# Patient Record
Sex: Female | Born: 1955
Health system: Southern US, Community
[De-identification: ages and names within clinical notes are randomized; demographics above are authoritative.]

## PROBLEM LIST (undated history)

## (undated) DIAGNOSIS — K5790 Diverticulosis of intestine, part unspecified, without perforation or abscess without bleeding: Secondary | ICD-10-CM

## (undated) DIAGNOSIS — F32A Depression, unspecified: Secondary | ICD-10-CM

## (undated) DIAGNOSIS — M1990 Unspecified osteoarthritis, unspecified site: Secondary | ICD-10-CM

## (undated) DIAGNOSIS — K611 Rectal abscess: Secondary | ICD-10-CM

## (undated) DIAGNOSIS — F329 Major depressive disorder, single episode, unspecified: Secondary | ICD-10-CM

## (undated) DIAGNOSIS — N390 Urinary tract infection, site not specified: Secondary | ICD-10-CM

## (undated) DIAGNOSIS — K648 Other hemorrhoids: Secondary | ICD-10-CM

## (undated) HISTORY — DX: Major depressive disorder, single episode, unspecified: F32.9

## (undated) HISTORY — DX: Rectal abscess: K61.1

## (undated) HISTORY — DX: Other hemorrhoids: K64.8

## (undated) HISTORY — DX: Diverticulosis of intestine, part unspecified, without perforation or abscess without bleeding: K57.90

## (undated) HISTORY — DX: Depression, unspecified: F32.A

## (undated) HISTORY — DX: Unspecified osteoarthritis, unspecified site: M19.90

## (undated) HISTORY — PX: OOPHORECTOMY: SHX86

## (undated) HISTORY — DX: Urinary tract infection, site not specified: N39.0

---

## 2000-12-08 HISTORY — PX: ABDOMINAL HYSTERECTOMY: SHX81

## 2004-10-08 ENCOUNTER — Ambulatory Visit: Payer: Self-pay | Admitting: Family Medicine

## 2004-12-19 LAB — HM PAP SMEAR: HM PAP: NORMAL

## 2005-04-07 HISTORY — PX: EXCISION OF ADNEXAL MASS: SHX5820

## 2005-04-24 ENCOUNTER — Inpatient Hospital Stay: Payer: Self-pay | Admitting: Unknown Physician Specialty

## 2005-10-16 ENCOUNTER — Ambulatory Visit: Payer: Self-pay | Admitting: Unknown Physician Specialty

## 2005-10-23 ENCOUNTER — Ambulatory Visit: Payer: Self-pay | Admitting: Unknown Physician Specialty

## 2007-04-01 ENCOUNTER — Ambulatory Visit: Payer: Self-pay | Admitting: Family Medicine

## 2007-09-14 ENCOUNTER — Ambulatory Visit: Payer: Self-pay | Admitting: Family Medicine

## 2007-12-09 HISTORY — PX: OTHER SURGICAL HISTORY: SHX169

## 2008-01-11 ENCOUNTER — Ambulatory Visit: Payer: Self-pay | Admitting: Surgery

## 2008-01-18 DIAGNOSIS — K612 Anorectal abscess: Secondary | ICD-10-CM | POA: Insufficient documentation

## 2008-11-20 ENCOUNTER — Emergency Department: Payer: Self-pay | Admitting: Emergency Medicine

## 2009-10-15 ENCOUNTER — Ambulatory Visit: Payer: Self-pay | Admitting: Gastroenterology

## 2009-10-15 LAB — HM COLONOSCOPY

## 2009-10-18 ENCOUNTER — Ambulatory Visit: Payer: Self-pay | Admitting: Family Medicine

## 2011-02-03 ENCOUNTER — Ambulatory Visit: Payer: Self-pay | Admitting: Family Medicine

## 2011-03-10 ENCOUNTER — Ambulatory Visit: Payer: Self-pay

## 2013-04-20 ENCOUNTER — Ambulatory Visit: Payer: Self-pay | Admitting: Family Medicine

## 2014-03-25 ENCOUNTER — Emergency Department: Payer: Self-pay | Admitting: Emergency Medicine

## 2014-03-25 LAB — CBC
HCT: 42.4 % (ref 35.0–47.0)
HGB: 13.8 g/dL (ref 12.0–16.0)
MCH: 28.4 pg (ref 26.0–34.0)
MCHC: 32.4 g/dL (ref 32.0–36.0)
MCV: 88 fL (ref 80–100)
PLATELETS: 228 10*3/uL (ref 150–440)
RBC: 4.84 10*6/uL (ref 3.80–5.20)
RDW: 13.2 % (ref 11.5–14.5)
WBC: 10.7 10*3/uL (ref 3.6–11.0)

## 2014-03-25 LAB — COMPREHENSIVE METABOLIC PANEL
ALK PHOS: 88 U/L
ALT: 26 U/L (ref 12–78)
Albumin: 3.9 g/dL (ref 3.4–5.0)
Anion Gap: 10 (ref 7–16)
BUN: 19 mg/dL — ABNORMAL HIGH (ref 7–18)
Bilirubin,Total: 0.5 mg/dL (ref 0.2–1.0)
CREATININE: 0.57 mg/dL — AB (ref 0.60–1.30)
Calcium, Total: 9.5 mg/dL (ref 8.5–10.1)
Chloride: 107 mmol/L (ref 98–107)
Co2: 22 mmol/L (ref 21–32)
EGFR (African American): >60
EGFR (Non-African Amer.): >60
Glucose: 98 mg/dL (ref 65–99)
Osmolality: 280 (ref 275–301)
POTASSIUM: 3.4 mmol/L — AB (ref 3.5–5.1)
SGOT(AST): 22 U/L (ref 15–37)
SODIUM: 139 mmol/L (ref 136–145)
Total Protein: 7.7 g/dL (ref 6.4–8.2)

## 2014-03-25 LAB — CK TOTAL AND CKMB (NOT AT ARMC)
CK, Total: 100 U/L
CK-MB: 1.1 ng/mL (ref 0.5–3.6)

## 2014-03-25 LAB — D-DIMER(ARMC): D-DIMER: 329 ng/mL

## 2014-03-25 LAB — TROPONIN I

## 2015-01-04 ENCOUNTER — Ambulatory Visit: Payer: Self-pay | Admitting: Family Medicine

## 2015-04-30 ENCOUNTER — Ambulatory Visit
Admission: RE | Admit: 2015-04-30 | Discharge: 2015-04-30 | Disposition: A | Discharge status: Home / Self Care | Payer: BLUE CROSS/BLUE SHIELD | Source: Ambulatory Visit | Attending: Family Medicine | Admitting: Family Medicine

## 2015-04-30 ENCOUNTER — Other Ambulatory Visit: Payer: Self-pay | Admitting: Family Medicine

## 2015-04-30 DIAGNOSIS — R0789 Other chest pain: Secondary | ICD-10-CM

## 2015-07-16 ENCOUNTER — Ambulatory Visit (INDEPENDENT_AMBULATORY_CARE_PROVIDER_SITE_OTHER): Payer: BLUE CROSS/BLUE SHIELD | Admitting: Family Medicine

## 2015-07-16 ENCOUNTER — Encounter: Payer: Self-pay | Admitting: Family Medicine

## 2015-07-16 VITALS — BP 100/64 | HR 80 | Temp 98.0°F | Resp 16 | Ht 64.0 in | Wt 163.0 lb

## 2015-07-16 DIAGNOSIS — I340 Nonrheumatic mitral (valve) insufficiency: Secondary | ICD-10-CM | POA: Insufficient documentation

## 2015-07-16 DIAGNOSIS — R739 Hyperglycemia, unspecified: Secondary | ICD-10-CM | POA: Insufficient documentation

## 2015-07-16 DIAGNOSIS — E559 Vitamin D deficiency, unspecified: Secondary | ICD-10-CM | POA: Insufficient documentation

## 2015-07-16 DIAGNOSIS — K631 Perforation of intestine (nontraumatic): Secondary | ICD-10-CM

## 2015-07-16 DIAGNOSIS — B370 Candidal stomatitis: Secondary | ICD-10-CM | POA: Diagnosis not present

## 2015-07-16 DIAGNOSIS — E785 Hyperlipidemia, unspecified: Secondary | ICD-10-CM | POA: Insufficient documentation

## 2015-07-16 DIAGNOSIS — N951 Menopausal and female climacteric states: Secondary | ICD-10-CM | POA: Insufficient documentation

## 2015-07-16 DIAGNOSIS — R0602 Shortness of breath: Secondary | ICD-10-CM | POA: Insufficient documentation

## 2015-07-16 DIAGNOSIS — M9420 Chondromalacia, unspecified site: Secondary | ICD-10-CM | POA: Insufficient documentation

## 2015-07-16 DIAGNOSIS — M1990 Unspecified osteoarthritis, unspecified site: Secondary | ICD-10-CM | POA: Insufficient documentation

## 2015-07-16 DIAGNOSIS — E538 Deficiency of other specified B group vitamins: Secondary | ICD-10-CM | POA: Insufficient documentation

## 2015-07-16 DIAGNOSIS — F32A Depression, unspecified: Secondary | ICD-10-CM | POA: Insufficient documentation

## 2015-07-16 DIAGNOSIS — F329 Major depressive disorder, single episode, unspecified: Secondary | ICD-10-CM | POA: Insufficient documentation

## 2015-07-16 DIAGNOSIS — R202 Paresthesia of skin: Secondary | ICD-10-CM | POA: Insufficient documentation

## 2015-07-16 DIAGNOSIS — E782 Mixed hyperlipidemia: Secondary | ICD-10-CM | POA: Insufficient documentation

## 2015-07-16 DIAGNOSIS — L538 Other specified erythematous conditions: Secondary | ICD-10-CM | POA: Insufficient documentation

## 2015-07-16 HISTORY — DX: Perforation of intestine (nontraumatic): K63.1

## 2015-07-16 MED ORDER — NYSTATIN 100000 UNIT/ML MT SUSP: 5.0000 mL | Freq: Four times a day (QID) | OROMUCOSAL | Status: DC

## 2015-07-16 NOTE — Progress Notes (Signed)
Subjective:    Patient ID: Patricia Hicks, female    DOB: 1956/05/03, 59 y.o.   MRN: [id number]  HPI  Dry Mouth Pt is c/o dry mouth. This has been occuring for 2 weeks. Pt also notes there is white film on her tongue. The problem is waxing and waning, pt states she thought it was improving until she ate at [employer] yesterday. Some things sting her lips. No recent antibiotics. Did have a cortisone shot in knee. Did great ( as an aside).    Review of Systems  Constitutional: Positive for appetite change (due to discomfort in mouth) and fatigue. Negative for fever, chills, diaphoresis, activity change and unexpected weight change.  HENT: Negative for drooling and mouth sores.        Mouth discomfort present  Endocrine: Positive for polydipsia and polyuria (due to increased thirste, most likely due to dry mouth). Negative for polyphagia (possibly secondary to dry mouth).   BP 100/64 mmHg  Pulse 80  Temp(Src) 98 F (36.7 C) (Oral)  Resp 16  Ht 5\' 4"  (1.626 m)  Wt 163 lb (73.936 kg)  BMI 27.97 kg/m2   Patient Active Problem List   Diagnosis Date Noted  . Arthritis 07/16/2015  . 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 B12 deficiency 07/16/2015  . Chondromalacia 07/16/2015  . Clinical depression 07/16/2015  . Elevated blood sugar 07/16/2015  . Elevated cholesterol with elevated triglycerides 07/16/2015  . MI (mitral incompetence) 07/16/2015  . Burning or prickling sensation 07/16/2015  . Post menopausal syndrome 07/16/2015  . Disruption of rectum 07/16/2015  . Shortness of breath at rest 07/16/2015  . Avitaminosis D 07/16/2015  . Abscess of anal and rectal regions 01/18/2008   No past medical history on file. No current outpatient prescriptions on file prior to visit.   No current facility-administered medications on file prior to visit.   Allergies  Allergen Reactions  . Sulfamethoxazole-Trimethoprim    Past Surgical History  Procedure Laterality Date  . Abdominal  hysterectomy  2002    Due to Fibroids  . Excision of adnexal mass Right 04/2005  . Oophorectomy Bilateral   . Rectal tear  2009    Rectal Abscess   History   Social History  . Marital Status: Married    Spouse Name: N/A  . Number of Children: 1  . Years of Education: PhD   Occupational History  . 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 Smoking status: Former Smoker -- 20 years    Types: Cigarettes    Quit date: 12/08/1992  . Smokeless tobacco: Never Used  . Alcohol Use: Yes     Comment: Occasional Wine  . Drug Use: No  . Sexual Activity: Not on file   Other Topics Concern  . Not on file   Social History Narrative   Family History  Problem Relation Age of Onset  . Dementia Father   . Congestive Heart Failure Father   . Thyroid disease Sister   . Suicidality Brother   . Breast cancer Maternal Grandmother   . Arrhythmia Sister        Objective:   Physical Exam  Constitutional: She is oriented to person, place, and time. She appears well-developed and well-nourished.  HENT:  Head: Normocephalic and atraumatic.  Nose: Nose normal.  Mouth/Throat: Oropharynx is clear and moist.  May some some white exudate on tongue.   Neck: Normal range of motion.  Neurological: She is alert and oriented to person, place, and time.   BP 100/64 mmHg  Pulse 80  Temp(Src) 98 F (36.7 C) (Oral)  Resp 16  Ht  (1.626 m)  Wt 163 lb (73.936 kg)  BMI 27.97 kg/m2      Assessment & Plan:  1. Thrush Condition is worsening. Will start medication for better control.  Patient instructed to call back if condition worsens or does not improve.    - nystatin (MYCOSTATIN) 100000 UNIT/ML suspension; Take 5 mLs (500,000 Units total) by mouth 4 (four) times daily. Swish and swallow.  Dispense: 60 mL; Refill: 0  Lorie Phenix, MD

## 2016-05-15 ENCOUNTER — Encounter: Payer: Self-pay | Admitting: Primary Care

## 2016-05-15 ENCOUNTER — Ambulatory Visit (INDEPENDENT_AMBULATORY_CARE_PROVIDER_SITE_OTHER): Payer: BLUE CROSS/BLUE SHIELD | Admitting: Primary Care

## 2016-05-15 VITALS — BP 104/64 | HR 58 | Temp 98.0°F | Ht 64.0 in | Wt 146.0 lb

## 2016-05-15 DIAGNOSIS — M1990 Unspecified osteoarthritis, unspecified site: Secondary | ICD-10-CM | POA: Diagnosis not present

## 2016-05-15 DIAGNOSIS — E782 Mixed hyperlipidemia: Secondary | ICD-10-CM | POA: Diagnosis not present

## 2016-05-15 DIAGNOSIS — F329 Major depressive disorder, single episode, unspecified: Secondary | ICD-10-CM | POA: Diagnosis not present

## 2016-05-15 DIAGNOSIS — E538 Deficiency of other specified B group vitamins: Secondary | ICD-10-CM

## 2016-05-15 DIAGNOSIS — Z0000 Encounter for general adult medical examination without abnormal findings: Secondary | ICD-10-CM | POA: Diagnosis not present

## 2016-05-15 DIAGNOSIS — Z1239 Encounter for other screening for malignant neoplasm of breast: Secondary | ICD-10-CM | POA: Diagnosis not present

## 2016-05-15 DIAGNOSIS — F32A Depression, unspecified: Secondary | ICD-10-CM

## 2016-05-15 DIAGNOSIS — K631 Perforation of intestine (nontraumatic): Secondary | ICD-10-CM

## 2016-05-15 LAB — CBC
HCT: 40.6 % (ref 36.0–46.0)
Hemoglobin: 13.4 g/dL (ref 12.0–15.0)
MCHC: 32.9 g/dL (ref 30.0–36.0)
MCV: 87.5 fl (ref 78.0–100.0)
PLATELETS: 250 10*3/uL (ref 150.0–400.0)
RBC: 4.64 Mil/uL (ref 3.87–5.11)
RDW: 13.5 % (ref 11.5–15.5)
WBC: 7.2 10*3/uL (ref 4.0–10.5)

## 2016-05-15 LAB — COMPREHENSIVE METABOLIC PANEL
ALK PHOS: 61 U/L (ref 39–117)
ALT: 12 U/L (ref 0–35)
AST: 15 U/L (ref 0–37)
Albumin: 4.3 g/dL (ref 3.5–5.2)
BILIRUBIN TOTAL: 0.8 mg/dL (ref 0.2–1.2)
BUN: 7 mg/dL (ref 6–23)
CO2: 30 meq/L (ref 19–32)
CREATININE: 0.67 mg/dL (ref 0.40–1.20)
Calcium: 9.5 mg/dL (ref 8.4–10.5)
Chloride: 103 mEq/L (ref 96–112)
GFR: 95.14 mL/min (ref >60.00)
GLUCOSE: 93 mg/dL (ref 70–99)
Potassium: 3.8 mEq/L (ref 3.5–5.1)
SODIUM: 140 meq/L (ref 135–145)
TOTAL PROTEIN: 6.9 g/dL (ref 6.0–8.3)

## 2016-05-15 LAB — HEMOGLOBIN A1C: Hgb A1c MFr Bld: 5.2 % (ref 4.6–6.5)

## 2016-05-15 LAB — VITAMIN D 25 HYDROXY (VIT D DEFICIENCY, FRACTURES): VITD: 42.14 ng/mL (ref 30.00–100.00)

## 2016-05-15 LAB — LIPID PANEL
CHOLESTEROL: 196 mg/dL (ref 0–200)
HDL: 68.6 mg/dL (ref >39.00)
LDL CALC: 110 mg/dL — AB (ref 0–99)
NonHDL: 127.23
TRIGLYCERIDES: 85 mg/dL (ref 0.0–149.0)
Total CHOL/HDL Ratio: 3
VLDL: 17 mg/dL (ref 0.0–40.0)

## 2016-05-15 LAB — TSH: TSH: 1.56 u[IU]/mL (ref 0.35–4.50)

## 2016-05-15 LAB — VITAMIN B12: VITAMIN B 12: 453 pg/mL (ref 211–911)

## 2016-05-15 NOTE — Progress Notes (Signed)
Pre visit review using our clinic review tool, if applicable. No additional management support is needed unless otherwise documented below in the visit note. 

## 2016-05-15 NOTE — Assessment & Plan Note (Signed)
Tetanus up-to-date. Declines Zostavax, will ask again next year. Eats a healthy diet and does not regularly exercise. Discussed importance of regular exercise. Mammogram ordered. Colonoscopy up-to-date. Exam unremarkable. Labs pending. Follow-up in one year for repeat physical.

## 2016-05-15 NOTE — Assessment & Plan Note (Signed)
Labs pending today.  

## 2016-05-15 NOTE — Assessment & Plan Note (Signed)
History of rectal abscess that was repaired in 2009. Does experience occasional bouts, which will reside with warm sits baths.

## 2016-05-15 NOTE — Patient Instructions (Addendum)
Complete lab work prior to leaving today. I will notify you of your results once received.   You will be contacted regarding your Mammogram.  Please let us know if you have not heard back within one week.   Continue your efforts towards a healthy lifestyle.   Ensure you are consuming 64 ounces of water daily.  Start exercising. You should be getting 1 hour of moderate intensity exercise 5 days weekly.  Follow up in 1 year for repeat physical or sooner if needed.  It was a pleasure to meet you today! Please don't hesitate to call me with any questions. Welcome to [facility]!

## 2016-05-15 NOTE — Assessment & Plan Note (Signed)
Vegetarian. Will obtain vitamin B12 levels today. Currently managed on 1000 g daily.

## 2016-05-15 NOTE — Assessment & Plan Note (Signed)
Intermittent, feels stable at this time. Does not believe she needs medications. Exam without evidence of depression.

## 2016-05-15 NOTE — Assessment & Plan Note (Signed)
Chronic. Did test slightly positive for rheumatoid arthritis, strong family history. Does not currently follow with rheumatology and she defers at this time as she does not wish to pursue medications.

## 2016-05-15 NOTE — Progress Notes (Signed)
Subjective:    Patient ID: Patricia Hicks, female    DOB: [birth year], 60 y.o.   MRN: [id number]  HPI  Ms. Patricia Hicks is a 60 year old female who presents today to establish care, discuss the problems mentioned below, and for complete physical. Will obtain old records. Her last physical was one year ago.   1) Vitamin B 12 Deficiency: Currently managed on 1000 mcg daily. She is a vegetarian.   2) Arthritis: Located to her left knee, feet, ankles, hands. She had evaluation for rheumatoid arthritis and did test positive. She does not currently follow with rheumatology as she does not wish to take medications. She once experienced dry eyes and dry mouth last summer that lasted through fall. She believes she has Sjograns, but was never diagnosed. Denies symptoms currently.  Immunizations: -Tetanus: Completed in 2013. -Influenza: Does not complete. -Shingles: Declines, would like to defer until next year.  Diet: Endorses a healthy diet. Breakfast: Toast, egg, nut mixtures, fruit Lunch: Salad, beans Dinner: Vegetables, grains Snacks: Crackers Desserts: Chocolate, cake.  Beverages: Coffee, water  Exercise: She does not currently exercise Eye exam: Completed in 2016 Dental exam: Completes every 6 month Colonoscopy: Completed 5-6 years ago. Normal. Pap Smear: Complete hysterectomy Mammogram: Completed 1 year ago, family history of breast cancer in her mother. Due.    Review of Systems  Constitutional: Negative for unexpected weight change.  HENT: Negative for rhinorrhea.   Respiratory: Negative for cough and shortness of breath.   Cardiovascular: Negative for chest pain.  Gastrointestinal: Negative for diarrhea and constipation.       History of rectal abscess in 2009. Does have occasional flares, will do epsom salt soaks.   Genitourinary: Negative for difficulty urinating.  Musculoskeletal: Positive for arthralgias. Negative for myalgias.  Skin: Negative for rash.  Allergic/Immunologic:  Positive for environmental allergies.  Neurological: Negative for dizziness, numbness and headaches.  Psychiatric/Behavioral:       Denies concerns for anxiety and depression. Does have situational depression, doesn't bother her.       Past Medical History  Diagnosis Date  . Urinary tract infection   . Arthritis   . Rectal abscess   . Depression      Social History   Social History  . Marital Status: Married    Spouse Name: N/A  . Number of Children: 1  . Years of Education: PhD   Occupational History  . 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 Smoking status: Former Smoker -- 20 years    Types: Cigarettes    Quit date: 12/08/1992  . Smokeless tobacco: Never Used  . Alcohol Use: Yes     Comment: Occasional Wine  . Drug Use: No  . Sexual Activity: Not on file   Other Topics Concern  . Not on file   Social History Narrative   Married.   1 child.   Works at [employer], teaches Social Work.   Enjoys gardening, walking her dog.     Past Surgical History  Procedure Laterality Date  . Abdominal hysterectomy  2002    Due to Fibroids  . Excision of adnexal mass Right 04/2005  . Oophorectomy Bilateral   . Rectal tear  2009    Rectal Abscess    Family History  Problem Relation Age of Onset  . Dementia Father   . Congestive Heart Failure Father   . Thyroid disease Sister   . Suicidality Brother   .  Breast cancer Maternal Grandmother   . Arrhythmia Sister   . Hypothyroidism Sister     Allergies  Allergen Reactions  . Sulfamethoxazole-Trimethoprim     Current Outpatient Prescriptions on File Prior to Visit  Medication Sig Dispense Refill  . Cholecalciferol (VITAMIN D) 2000 UNITS CAPS Take by mouth. Only takes in the winter    . vitamin B-12 (CYANOCOBALAMIN) 1000 MCG tablet Take 1,000 mcg by mouth daily.      No current facility-administered medications on file prior to visit.    BP 104/64 mmHg  Pulse  58  Temp(Src) 98 F (36.7 C) (Oral)  Ht  (1.626 m)  Wt 146 lb (66.225 kg)  BMI 25.05 kg/m2  SpO2 98%    Objective:   Physical Exam  Constitutional: She is oriented to person, place, and time. She appears well-nourished.  HENT:  Right Ear: Tympanic membrane and ear canal normal.  Left Ear: Tympanic membrane and ear canal normal.  Nose: Nose normal.  Mouth/Throat: Oropharynx is clear and moist.  Eyes: Conjunctivae and EOM are normal. Pupils are equal, round, and reactive to light.  Neck: Neck supple. No thyromegaly present.  Cardiovascular: Normal rate and regular rhythm.   No murmur heard. Pulmonary/Chest: Effort normal and breath sounds normal. She has no rales.  Abdominal: Soft. Bowel sounds are normal. There is no tenderness.  Musculoskeletal: Normal range of motion.  Lymphadenopathy:    She has no cervical adenopathy.  Neurological: She is alert and oriented to person, place, and time. She has normal reflexes. No cranial nerve deficit.  Skin: Skin is warm and dry. No rash noted.  Psychiatric: She has a normal mood and affect.          Assessment & Plan:

## 2016-05-16 LAB — HEPATITIS C ANTIBODY: HCV Ab: NEGATIVE

## 2016-05-30 ENCOUNTER — Encounter: Payer: Self-pay | Admitting: Primary Care

## 2016-09-08 ENCOUNTER — Ambulatory Visit
Admission: RE | Admit: 2016-09-08 | Discharge: 2016-09-08 | Disposition: A | Discharge status: Home / Self Care | Payer: BLUE CROSS/BLUE SHIELD | Source: Ambulatory Visit | Attending: Primary Care | Admitting: Primary Care

## 2016-09-08 ENCOUNTER — Other Ambulatory Visit: Payer: Self-pay | Admitting: Primary Care

## 2016-09-08 DIAGNOSIS — Z1239 Encounter for other screening for malignant neoplasm of breast: Secondary | ICD-10-CM

## 2016-09-08 DIAGNOSIS — Z1231 Encounter for screening mammogram for malignant neoplasm of breast: Secondary | ICD-10-CM

## 2016-09-26 DIAGNOSIS — M1712 Unilateral primary osteoarthritis, left knee: Secondary | ICD-10-CM | POA: Diagnosis not present

## 2016-11-17 ENCOUNTER — Ambulatory Visit (INDEPENDENT_AMBULATORY_CARE_PROVIDER_SITE_OTHER): Payer: BLUE CROSS/BLUE SHIELD | Admitting: Primary Care

## 2016-11-17 ENCOUNTER — Encounter: Payer: Self-pay | Admitting: Primary Care

## 2016-11-17 VITALS — BP 136/84 | HR 86 | Temp 98.1°F | Wt 145.5 lb

## 2016-11-17 DIAGNOSIS — J069 Acute upper respiratory infection, unspecified: Secondary | ICD-10-CM

## 2016-11-17 MED ORDER — BENZONATATE 200 MG PO CAPS: 200.0000 mg | ORAL_CAPSULE | Freq: Three times a day (TID) | ORAL | 0 refills | Status: DC | PRN

## 2016-11-17 MED ORDER — AZITHROMYCIN 250 MG PO TABS: ORAL_TABLET | ORAL | 0 refills | Status: DC

## 2016-11-17 NOTE — Patient Instructions (Signed)
Start Azithromycin antibiotics. Take 2 tablets by mouth today, then 1 tablet daily for 4 additional days.  You may take Benzonatate capsules for cough. Take 1 capsule by mouth three times daily as needed for cough. You could also try taking Delsym for cough, this may be purchased over the counter.  Throat drainage: An antihistamine such as Claritin, Zyrtec, Allegra will help to dry up any drainage.  Ensure you are staying hydrated with water and rest.  It was a pleasure to see you today!

## 2016-11-17 NOTE — Progress Notes (Signed)
Subjective:    Patient ID: Oda KiltsBeth Dunavan, female    DOB: 25-Sep-1956, 60 y.o.   MRN: [id number]  HPI  [name] is a 60 year old female who presents today with a chief complaint of cough. She also reports voice hoarseness, fatigue, sore throat, chills. Her cough is productive with yellowish sputum. Her symptoms have been present for the past 10 + days. She's taken Robitussin, Nyquil with temporary improvement. Overall she started feeling worse this past weekend. She denies fevers, sick contacts. She will be leaving for a trip abroad later this week.  Review of Systems  Constitutional: Positive for chills and fatigue. Negative for fever.  HENT: Positive for congestion, sinus pressure, sore throat and voice change.   Respiratory: Positive for cough. Negative for shortness of breath and wheezing.        Past Medical History:  Diagnosis Date  . Arthritis   . Depression   . Diverticulosis    found via colonoscopy  . Internal hemorrhoids   . Rectal abscess   . Urinary tract infection      Social History   Social History  . Marital status: Married    Spouse name: N/A  . Number of children: 1  . Years of education: PhD   Occupational History  . 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 Smoking status: Former Smoker    Years: 20.00    Types: Cigarettes    Quit date: 12/08/1992  . Smokeless tobacco: Never Used  . Alcohol use Yes     Comment: Occasional Wine  . Drug use: No  . Sexual activity: Not on file   Other Topics Concern  . Not on file   Social History Narrative   Married.   1 child.   Works at [employer], teaches Social Work.   Enjoys gardening, walking her dog.     Past Surgical History:  Procedure Laterality Date  . ABDOMINAL HYSTERECTOMY  2002   Due to Fibroids  . EXCISION OF ADNEXAL MASS Right 04/2005  . OOPHORECTOMY Bilateral   . Rectal tear  2009   Rectal Abscess    Family History  Problem Relation  Age of Onset  . Dementia Father   . Congestive Heart Failure Father   . Thyroid disease Sister   . Suicidality Brother   . Breast cancer Maternal Grandmother   . Breast cancer Mother [id number]    dx twice 1977 and [phone]  . Arrhythmia Sister   . Hypothyroidism Sister     Allergies  Allergen Reactions  . Sulfamethoxazole-Trimethoprim     Current Outpatient Prescriptions on File Prior to Visit  Medication Sig Dispense Refill  . vitamin B-12 (CYANOCOBALAMIN) 1000 MCG tablet Take 1,000 mcg by mouth daily.     . Cholecalciferol (VITAMIN D) 2000 UNITS CAPS Take by mouth. Only takes in the winter     No current facility-administered medications on file prior to visit.     BP 136/84   Pulse 86   Temp 98.1 F (36.7 C) (Oral)   Wt 145 lb 8 oz (66 kg)   SpO2 99%   BMI 24.98 kg/m    Objective:   Physical Exam  Constitutional: She appears well-nourished.  HENT:  Right Ear: Tympanic membrane and ear canal normal.  Left Ear: Tympanic membrane and ear canal normal.  Nose: Right sinus exhibits no maxillary sinus tenderness and no frontal sinus tenderness. Left sinus exhibits no  maxillary sinus tenderness and no frontal sinus tenderness.  Mouth/Throat: Oropharynx is clear and moist.  Eyes: Conjunctivae are normal.  Neck: Neck supple.  Cardiovascular: Normal rate and regular rhythm.   Pulmonary/Chest: Effort normal. She has wheezes in the right upper field and the left upper field. She has rhonchi in the right upper field and the left upper field. She has no rales.  Lymphadenopathy:    She has no cervical adenopathy.  Skin: Skin is warm and dry.          Assessment & Plan:  URI:  Cough, congestion, fatigue x 10 days. Overall feeling worse, temporary improvement with OTC treatment. Exam today with rhonchi and mild wheezing to upper fields. Persistent cough during exam. Does appear ill. Given duration of symptoms, no improvement (feeling worse), will treat. Rx for Zpak, Tessalon pearls  sent to pharmacy. Discussed use of antihistamines for drainage. Fluids, rest, follow up PRN.  Morrie Sheldonlark,Ladasia Sircy Kendal, NP

## 2016-11-17 NOTE — Progress Notes (Signed)
Pre visit review using our clinic review tool, if applicable. No additional management support is needed unless otherwise documented below in the visit note. 

## 2017-06-16 DIAGNOSIS — L708 Other acne: Secondary | ICD-10-CM | POA: Diagnosis not present

## 2017-12-15 ENCOUNTER — Encounter: Payer: BLUE CROSS/BLUE SHIELD | Admitting: Primary Care

## 2017-12-30 ENCOUNTER — Ambulatory Visit (INDEPENDENT_AMBULATORY_CARE_PROVIDER_SITE_OTHER): Payer: BLUE CROSS/BLUE SHIELD | Admitting: Primary Care

## 2017-12-30 ENCOUNTER — Encounter: Payer: Self-pay | Admitting: Primary Care

## 2017-12-30 VITALS — BP 120/76 | HR 66 | Temp 97.9°F | Ht 64.0 in | Wt 151.4 lb

## 2017-12-30 DIAGNOSIS — M1990 Unspecified osteoarthritis, unspecified site: Secondary | ICD-10-CM

## 2017-12-30 DIAGNOSIS — E782 Mixed hyperlipidemia: Secondary | ICD-10-CM | POA: Diagnosis not present

## 2017-12-30 DIAGNOSIS — F3342 Major depressive disorder, recurrent, in full remission: Secondary | ICD-10-CM | POA: Diagnosis not present

## 2017-12-30 DIAGNOSIS — E559 Vitamin D deficiency, unspecified: Secondary | ICD-10-CM

## 2017-12-30 DIAGNOSIS — E538 Deficiency of other specified B group vitamins: Secondary | ICD-10-CM | POA: Diagnosis not present

## 2017-12-30 DIAGNOSIS — Z0000 Encounter for general adult medical examination without abnormal findings: Secondary | ICD-10-CM | POA: Diagnosis not present

## 2017-12-30 DIAGNOSIS — Z1231 Encounter for screening mammogram for malignant neoplasm of breast: Secondary | ICD-10-CM | POA: Diagnosis not present

## 2017-12-30 DIAGNOSIS — Z1239 Encounter for other screening for malignant neoplasm of breast: Secondary | ICD-10-CM

## 2017-12-30 LAB — COMPREHENSIVE METABOLIC PANEL
ALK PHOS: 61 U/L (ref 39–117)
ALT: 11 U/L (ref 0–35)
AST: 14 U/L (ref 0–37)
Albumin: 4.1 g/dL (ref 3.5–5.2)
BILIRUBIN TOTAL: 0.7 mg/dL (ref 0.2–1.2)
BUN: 11 mg/dL (ref 6–23)
CO2: 29 meq/L (ref 19–32)
Calcium: 9.4 mg/dL (ref 8.4–10.5)
Chloride: 103 mEq/L (ref 96–112)
Creatinine, Ser: 0.63 mg/dL (ref 0.40–1.20)
GFR: 101.6 mL/min (ref >60.00)
GLUCOSE: 96 mg/dL (ref 70–99)
Potassium: 4.2 mEq/L (ref 3.5–5.1)
SODIUM: 140 meq/L (ref 135–145)
TOTAL PROTEIN: 7 g/dL (ref 6.0–8.3)

## 2017-12-30 LAB — LIPID PANEL
CHOLESTEROL: 225 mg/dL — AB (ref 0–200)
HDL: 68.2 mg/dL (ref >39.00)
LDL CALC: 141 mg/dL — AB (ref 0–99)
NonHDL: 156.9
TRIGLYCERIDES: 81 mg/dL (ref 0.0–149.0)
Total CHOL/HDL Ratio: 3
VLDL: 16.2 mg/dL (ref 0.0–40.0)

## 2017-12-30 LAB — VITAMIN D 25 HYDROXY (VIT D DEFICIENCY, FRACTURES): VITD: 29.54 ng/mL — AB (ref 30.00–100.00)

## 2017-12-30 LAB — VITAMIN B12: Vitamin B-12: 209 pg/mL — ABNORMAL LOW (ref 211–911)

## 2017-12-30 NOTE — Assessment & Plan Note (Signed)
Denies symptoms and a history of depression.

## 2017-12-30 NOTE — Assessment & Plan Note (Signed)
Due for repeat Vitamin D today. Pending. Currently not taking Vitamin D.

## 2017-12-30 NOTE — Patient Instructions (Signed)
Stop by the lab prior to leaving today. I will notify you of your results once received.   Call the [facility] to schedule your mammogram.   Start exercising. You should be getting 150 minutes of moderate intensity exercise weekly.  Limit alcohol and sweets to avoid excess calories.  Follow up in 1 year for your annual exam or sooner if needed.  It was a pleasure to see you today!   Preventive Care 40-64 Years, Female Preventive care refers to lifestyle choices and visits with your health care provider that can promote health and wellness. What does preventive care include?  A yearly physical exam. This is also called an annual well check.  Dental exams once or twice a year.  Routine eye exams. Ask your health care provider how often you should have your eyes checked.  Personal lifestyle choices, including: ? Daily care of your teeth and gums. ? Regular physical activity. ? Eating a healthy diet. ? Avoiding tobacco and drug use. ? Limiting alcohol use. ? Practicing safe sex. ? Taking low-dose aspirin daily starting at age 41. ? Taking vitamin and mineral supplements as recommended by your health care provider. What happens during an annual well check? The services and screenings done by your health care provider during your annual well check will depend on your age, overall health, lifestyle risk factors, and family history of disease. Counseling Your health care provider may ask you questions about your:  Alcohol use.  Tobacco use.  Drug use.  Emotional well-being.  Home and relationship well-being.  Sexual activity.  Eating habits.  Work and work Statistician.  Method of birth control.  Menstrual cycle.  Pregnancy history.  Screening You may have the following tests or measurements:  Height, weight, and BMI.  Blood pressure.  Lipid and cholesterol levels. These may be checked every 5 years, or more frequently if you are over 25 years  old.  Skin check.  Lung cancer screening. You may have this screening every year starting at age 19 if you have a 30-pack-year history of smoking and currently smoke or have quit within the past 15 years.  Fecal occult blood test (FOBT) of the stool. You may have this test every year starting at age 68.  Flexible sigmoidoscopy or colonoscopy. You may have a sigmoidoscopy every 5 years or a colonoscopy every 10 years starting at age 35.  Hepatitis C blood test.  Hepatitis B blood test.  Sexually transmitted disease (STD) testing.  Diabetes screening. This is done by checking your blood sugar (glucose) after you have not eaten for a while (fasting). You may have this done every 1-3 years.  Mammogram. This may be done every 1-2 years. Talk to your health care provider about when you should start having regular mammograms. This may depend on whether you have a family history of breast cancer.  BRCA-related cancer screening. This may be done if you have a family history of breast, ovarian, tubal, or peritoneal cancers.  Pelvic exam and Pap test. This may be done every 3 years starting at age 67. Starting at age 54, this may be done every 5 years if you have a Pap test in combination with an HPV test.  Bone density scan. This is done to screen for osteoporosis. You may have this scan if you are at high risk for osteoporosis.  Discuss your test results, treatment options, and if necessary, the need for more tests with your health care provider. Vaccines Your health care  provider may recommend certain vaccines, such as:  Influenza vaccine. This is recommended every year.  Tetanus, diphtheria, and acellular pertussis (Tdap, Td) vaccine. You may need a Td booster every 10 years.  Varicella vaccine. You may need this if you have not been vaccinated.  Zoster vaccine. You may need this after age 92.  Measles, mumps, and rubella (MMR) vaccine. You may need at least one dose of MMR if you were  born in 1957 or later. You may also need a second dose.  Pneumococcal 13-valent conjugate (PCV13) vaccine. You may need this if you have certain conditions and were not previously vaccinated.  Pneumococcal polysaccharide (PPSV23) vaccine. You may need one or two doses if you smoke cigarettes or if you have certain conditions.  Meningococcal vaccine. You may need this if you have certain conditions.  Hepatitis A vaccine. You may need this if you have certain conditions or if you travel or work in places where you may be exposed to hepatitis A.  Hepatitis B vaccine. You may need this if you have certain conditions or if you travel or work in places where you may be exposed to hepatitis B.  Haemophilus influenzae type b (Hib) vaccine. You may need this if you have certain conditions.  Talk to your health care provider about which screenings and vaccines you need and how often you need them. This information is not intended to replace advice given to you by your health care provider. Make sure you discuss any questions you have with your health care provider. Document Released: 12/21/2015 Document Revised: 08/13/2016 Document Reviewed: 09/25/2015 Elsevier Interactive Patient Education  [employer].

## 2017-12-30 NOTE — Progress Notes (Signed)
Subjective:    Patient ID: Patricia Hicks, female    DOB: 1956/10/15, 62 y.o.   MRN: [id number]  HPI  Patricia Hicks is a 62 year old female who presents today for complete physical.  Immunizations: -Tetanus: Completed in 2013 -Influenza: Declines   Diet: She endorses a healthy diet. Breakfast: Fruit with yogurt, toast with butter; eggs and toast Lunch: Salad, soup Dinner: Soup, potatoes, vegetables Snacks: Occasionally, candied ginger Desserts: Cookies, candy (Daily) Beverages: Coffee, wine, water  Exercise: She is not currently exercising, does walk her dog daily. Starting to do hand weights. Plans on starting Yoga and Joanna.  Eye exam: Completed years ago Dental exam: Completes annually. Colonoscopy: Completed in 2010, due in 2020 Pap Smear: Hysterectomy Mammogram: Completed in 2017, due again.    Review of Systems  Constitutional: Negative for unexpected weight change.  HENT: Negative for rhinorrhea.   Respiratory: Negative for cough and shortness of breath.   Cardiovascular: Negative for chest pain.  Gastrointestinal: Negative for constipation and diarrhea.  Genitourinary: Negative for difficulty urinating and menstrual problem.  Musculoskeletal: Negative for arthralgias and myalgias.  Skin: Negative for rash.  Allergic/Immunologic: Negative for environmental allergies.  Neurological: Negative for dizziness, numbness and headaches.  Psychiatric/Behavioral:       Denies concerns for depression and anxiety       Past Medical History:  Diagnosis Date  . Arthritis   . Depression   . Diverticulosis    found via colonoscopy  . Internal hemorrhoids   . Rectal abscess   . Urinary tract infection      Social History   Socioeconomic History  . Marital status: Married    Spouse name: Not on file  . Number of children: 1  . Years of education: PhD  . [facility] education level: Not on file  Social Needs  . Financial resource strain: Not on file  . Food insecurity  - worry: Not on file  . Food insecurity - inability: Not on file  . Transportation needs - medical: Not on file  . Transportation needs - non-medical: Not on file  Occupational History  . Occupation: Professor    Employer: [employer]    Comment: Psychologist, sport and exercise for [employer]  Tobacco Use  . Smoking status: Former Smoker    Years: 20.00    Types: Cigarettes    Last attempt to quit: 12/08/1992    Years since quitting: 25.0  . Smokeless tobacco: Never Used  Substance and Sexual Activity  . Alcohol use: Yes    Comment: Occasional Wine  . Drug use: No  . Sexual activity: Not on file  Other Topics Concern  . Not on file  Social History Narrative   Married.   1 child.   Works at [employer], teaches Social Work.   Enjoys gardening, walking her dog.     Past Surgical History:  Procedure Laterality Date  . ABDOMINAL HYSTERECTOMY  2002   Due to Fibroids  . EXCISION OF ADNEXAL MASS Right 04/2005  . OOPHORECTOMY Bilateral   . Rectal tear  2009   Rectal Abscess    Family History  Problem Relation Age of Onset  . Dementia Father   . Congestive Heart Failure Father   . Thyroid disease Sister   . Suicidality Brother   . Breast cancer Maternal Grandmother   . Breast cancer Mother 32       dx twice 57 and 67  . Arrhythmia Sister   . Hypothyroidism Sister  Allergies  Allergen Reactions  . Sulfamethoxazole-Trimethoprim     No current outpatient medications on file prior to visit.   No current facility-administered medications on file prior to visit.     BP 120/76   Pulse 66   Temp 97.9 F (36.6 C) (Oral)   Ht _0  (1.626 m)   Wt 151 lb 6.4 oz (68.7 kg)   SpO2 98%   BMI 25.99 kg/m    Objective:   Physical Exam  Constitutional: She is oriented to person, place, and time. She appears well-nourished.  HENT:  Right Ear: Tympanic membrane and ear canal normal.  Left Ear: Tympanic membrane and ear canal normal.  Nose: Nose normal.  Mouth/Throat: Oropharynx  is clear and moist.  Eyes: Conjunctivae and EOM are normal. Pupils are equal, round, and reactive to light.  Neck: Neck supple. No thyromegaly present.  Cardiovascular: Normal rate and regular rhythm.  No murmur heard. Pulmonary/Chest: Effort normal and breath sounds normal. She has no rales.  Abdominal: Soft. Bowel sounds are normal. There is no tenderness.  Musculoskeletal: Normal range of motion.  Lymphadenopathy:    She has no cervical adenopathy.  Neurological: She is alert and oriented to person, place, and time. She has normal reflexes. No cranial nerve deficit.  Skin: Skin is warm and dry. No rash noted.  Psychiatric: She has a normal mood and affect.          Assessment & Plan:

## 2017-12-30 NOTE — Assessment & Plan Note (Signed)
Vegetarian. Will check B 12 today.

## 2017-12-30 NOTE — Assessment & Plan Note (Signed)
Tdap UTD, declines influenza vaccination. Mammogram due, pending. Colonoscopy UTD, due in 2020. Discussed the importance of a healthy diet and regular exercise in order for weight loss, and to reduce the risk of any potential medical problems. Exam unremarkable. Labs pending.  Follow up in 1 year.

## 2017-12-30 NOTE — Assessment & Plan Note (Signed)
Located to generalized body. Previously following with [facility] intermittently for knee injections. Plans on starting Yoga and [place].

## 2017-12-31 ENCOUNTER — Other Ambulatory Visit: Payer: Self-pay | Admitting: Primary Care

## 2017-12-31 DIAGNOSIS — E538 Deficiency of other specified B group vitamins: Secondary | ICD-10-CM

## 2017-12-31 DIAGNOSIS — E785 Hyperlipidemia, unspecified: Secondary | ICD-10-CM

## 2017-12-31 DIAGNOSIS — E559 Vitamin D deficiency, unspecified: Secondary | ICD-10-CM

## 2018-01-12 ENCOUNTER — Ambulatory Visit
Admission: RE | Admit: 2018-01-12 | Discharge: 2018-01-12 | Disposition: A | Discharge status: Home / Self Care | Payer: BLUE CROSS/BLUE SHIELD | Source: Ambulatory Visit | Attending: Primary Care | Admitting: Primary Care

## 2018-01-12 ENCOUNTER — Other Ambulatory Visit: Payer: Self-pay | Admitting: Primary Care

## 2018-01-12 DIAGNOSIS — R921 Mammographic calcification found on diagnostic imaging of breast: Secondary | ICD-10-CM

## 2018-01-12 DIAGNOSIS — R928 Other abnormal and inconclusive findings on diagnostic imaging of breast: Secondary | ICD-10-CM

## 2018-01-12 DIAGNOSIS — Z1231 Encounter for screening mammogram for malignant neoplasm of breast: Secondary | ICD-10-CM | POA: Insufficient documentation

## 2018-01-12 DIAGNOSIS — Z1239 Encounter for other screening for malignant neoplasm of breast: Secondary | ICD-10-CM

## 2018-01-19 ENCOUNTER — Ambulatory Visit
Admission: RE | Admit: 2018-01-19 | Discharge: 2018-01-19 | Disposition: A | Discharge status: Home / Self Care | Payer: BLUE CROSS/BLUE SHIELD | Source: Ambulatory Visit | Attending: Primary Care | Admitting: Primary Care

## 2018-01-19 DIAGNOSIS — R928 Other abnormal and inconclusive findings on diagnostic imaging of breast: Secondary | ICD-10-CM | POA: Diagnosis not present

## 2018-01-19 DIAGNOSIS — R921 Mammographic calcification found on diagnostic imaging of breast: Secondary | ICD-10-CM

## 2018-09-10 DIAGNOSIS — H43811 Vitreous degeneration, right eye: Secondary | ICD-10-CM | POA: Diagnosis not present

## 2018-12-09 DIAGNOSIS — H43811 Vitreous degeneration, right eye: Secondary | ICD-10-CM | POA: Diagnosis not present

## 2019-01-03 ENCOUNTER — Encounter: Payer: Self-pay | Admitting: Primary Care

## 2019-01-03 ENCOUNTER — Ambulatory Visit (INDEPENDENT_AMBULATORY_CARE_PROVIDER_SITE_OTHER): Payer: BLUE CROSS/BLUE SHIELD | Admitting: Primary Care

## 2019-01-03 VITALS — BP 126/80 | HR 67 | Temp 98.0°F | Ht 64.0 in | Wt 150.0 lb

## 2019-01-03 DIAGNOSIS — E559 Vitamin D deficiency, unspecified: Secondary | ICD-10-CM | POA: Diagnosis not present

## 2019-01-03 DIAGNOSIS — I340 Nonrheumatic mitral (valve) insufficiency: Secondary | ICD-10-CM

## 2019-01-03 DIAGNOSIS — Z0000 Encounter for general adult medical examination without abnormal findings: Secondary | ICD-10-CM | POA: Diagnosis not present

## 2019-01-03 DIAGNOSIS — Z23 Encounter for immunization: Secondary | ICD-10-CM

## 2019-01-03 DIAGNOSIS — Z1239 Encounter for other screening for malignant neoplasm of breast: Secondary | ICD-10-CM

## 2019-01-03 DIAGNOSIS — M1990 Unspecified osteoarthritis, unspecified site: Secondary | ICD-10-CM

## 2019-01-03 DIAGNOSIS — E538 Deficiency of other specified B group vitamins: Secondary | ICD-10-CM

## 2019-01-03 DIAGNOSIS — E782 Mixed hyperlipidemia: Secondary | ICD-10-CM | POA: Diagnosis not present

## 2019-01-03 LAB — COMPREHENSIVE METABOLIC PANEL
ALT: 14 U/L (ref 0–35)
AST: 16 U/L (ref 0–37)
Albumin: 4.5 g/dL (ref 3.5–5.2)
Alkaline Phosphatase: 54 U/L (ref 39–117)
BUN: 8 mg/dL (ref 6–23)
CHLORIDE: 101 meq/L (ref 96–112)
CO2: 29 meq/L (ref 19–32)
Calcium: 9.9 mg/dL (ref 8.4–10.5)
Creatinine, Ser: 0.7 mg/dL (ref 0.40–1.20)
GFR: 84.37 mL/min (ref >60.00)
GLUCOSE: 87 mg/dL (ref 70–99)
POTASSIUM: 3.6 meq/L (ref 3.5–5.1)
SODIUM: 139 meq/L (ref 135–145)
Total Bilirubin: 0.7 mg/dL (ref 0.2–1.2)
Total Protein: 7.1 g/dL (ref 6.0–8.3)

## 2019-01-03 LAB — LIPID PANEL
Cholesterol: 227 mg/dL — ABNORMAL HIGH (ref 0–200)
HDL: 78.9 mg/dL (ref >39.00)
LDL CALC: 126 mg/dL — AB (ref 0–99)
NONHDL: 147.98
Total CHOL/HDL Ratio: 3
Triglycerides: 109 mg/dL (ref 0.0–149.0)
VLDL: 21.8 mg/dL (ref 0.0–40.0)

## 2019-01-03 LAB — VITAMIN D 25 HYDROXY (VIT D DEFICIENCY, FRACTURES): VITD: 52.9 ng/mL (ref 30.00–100.00)

## 2019-01-03 LAB — VITAMIN B12: VITAMIN B 12: 514 pg/mL (ref 211–911)

## 2019-01-03 MED ORDER — ZOSTER VAC RECOMB ADJUVANTED 50 MCG/0.5ML IM SUSR: 0.5000 mL | Freq: Once | INTRAMUSCULAR | 1 refills | Status: AC

## 2019-01-03 NOTE — Assessment & Plan Note (Signed)
Compliant to vitamin D 1000 units daily. Add in calcium 1200 mg daily. Repeat vitamin D pending.

## 2019-01-03 NOTE — Assessment & Plan Note (Signed)
Taking B12 OTC 1000 mcg daily.  Repeat B12 pending.

## 2019-01-03 NOTE — Patient Instructions (Signed)
Stop by the lab prior to leaving today. I will notify you of your results once received.   Call the [facility] to schedule your mammogram.  Complete the Cologuard kit as directed.  Start taking 1200 mg of calcium with your vitamin D daily.  Take the shingles vaccination to your pharmacy for administration.   Continue exercising. You should be getting 150 minutes of moderate intensity exercise weekly.  Continue to work on a healthy diet. Ensure you are consuming 64 ounces of water daily.  You will be contacted regarding your echocardiogram.  Please let us know if you have not been contacted within one week.   We will see you in one year for your annual exam or sooner if needed.  It was a pleasure to see you today!   Preventive Care 40-64 Years, Female Preventive care refers to lifestyle choices and visits with your health care provider that can promote health and wellness. What does preventive care include?   A yearly physical exam. This is also called an annual well check.  Dental exams once or twice a year.  Routine eye exams. Ask your health care provider how often you should have your eyes checked.  Personal lifestyle choices, including: ? Daily care of your teeth and gums. ? Regular physical activity. ? Eating a healthy diet. ? Avoiding tobacco and drug use. ? Limiting alcohol use. ? Practicing safe sex. ? Taking low-dose aspirin daily starting at age 26. ? Taking vitamin and mineral supplements as recommended by your health care provider. What happens during an annual well check? The services and screenings done by your health care provider during your annual well check will depend on your age, overall health, lifestyle risk factors, and family history of disease. Counseling Your health care provider may ask you questions about your:  Alcohol use.  Tobacco use.  Drug use.  Emotional well-being.  Home and relationship well-being.  Sexual  activity.  Eating habits.  Work and work Statistician.  Method of birth control.  Menstrual cycle.  Pregnancy history. Screening You may have the following tests or measurements:  Height, weight, and BMI.  Blood pressure.  Lipid and cholesterol levels. These may be checked every 5 years, or more frequently if you are over 43 years old.  Skin check.  Lung cancer screening. You may have this screening every year starting at age 70 if you have a 30-pack-year history of smoking and currently smoke or have quit within the past 15 years.  Colorectal cancer screening. All adults should have this screening starting at age 13 and continuing until age 32. Your health care provider may recommend screening at age 27. You will have tests every 1-10 years, depending on your results and the type of screening test. People at increased risk should start screening at an earlier age. Screening tests may include: ? Guaiac-based fecal occult blood testing. ? Fecal immunochemical test (FIT). ? Stool DNA test. ? Virtual colonoscopy. ? Sigmoidoscopy. During this test, a flexible tube with a tiny camera (sigmoidoscope) is used to examine your rectum and lower colon. The sigmoidoscope is inserted through your anus into your rectum and lower colon. ? Colonoscopy. During this test, a long, thin, flexible tube with a tiny camera (colonoscope) is used to examine your entire colon and rectum.  Hepatitis C blood test.  Hepatitis B blood test.  Sexually transmitted disease (STD) testing.  Diabetes screening. This is done by checking your blood sugar (glucose) after you have not eaten  for a while (fasting). You may have this done every 1-3 years.  Mammogram. This may be done every 1-2 years. Talk to your health care provider about when you should start having regular mammograms. This may depend on whether you have a family history of breast cancer.  BRCA-related cancer screening. This may be done if you have  a family history of breast, ovarian, tubal, or peritoneal cancers.  Pelvic exam and Pap test. This may be done every 3 years starting at age 25. Starting at age 74, this may be done every 5 years if you have a Pap test in combination with an HPV test.  Bone density scan. This is done to screen for osteoporosis. You may have this scan if you are at high risk for osteoporosis. Discuss your test results, treatment options, and if necessary, the need for more tests with your health care provider. Vaccines Your health care provider may recommend certain vaccines, such as:  Influenza vaccine. This is recommended every year.  Tetanus, diphtheria, and acellular pertussis (Tdap, Td) vaccine. You may need a Td booster every 10 years.  Varicella vaccine. You may need this if you have not been vaccinated.  Zoster vaccine. You may need this after age 27.  Measles, mumps, and rubella (MMR) vaccine. You may need at least one dose of MMR if you were born in 1957 or later. You may also need a second dose.  Pneumococcal 13-valent conjugate (PCV13) vaccine. You may need this if you have certain conditions and were not previously vaccinated.  Pneumococcal polysaccharide (PPSV23) vaccine. You may need one or two doses if you smoke cigarettes or if you have certain conditions.  Meningococcal vaccine. You may need this if you have certain conditions.  Hepatitis A vaccine. You may need this if you have certain conditions or if you travel or work in places where you may be exposed to hepatitis A.  Hepatitis B vaccine. You may need this if you have certain conditions or if you travel or work in places where you may be exposed to hepatitis B.  Haemophilus influenzae type b (Hib) vaccine. You may need this if you have certain conditions. Talk to your health care provider about which screenings and vaccines you need and how often you need them. This information is not intended to replace advice given to you by  your health care provider. Make sure you discuss any questions you have with your health care provider. Document Released: 12/21/2015 Document Revised: 01/14/2018 Document Reviewed: 09/25/2015 Elsevier Interactive Patient Education  2019 [employer].

## 2019-01-03 NOTE — Assessment & Plan Note (Signed)
Intermittent to knees, overall manages well. Recommended regular exercise.

## 2019-01-03 NOTE — Assessment & Plan Note (Signed)
Tetanus UTD, declines influenza vaccination. Rx for Shingrix provided. Mammogram pending. Colonoscopy due later this year and she declines. She opts for Cologuard.  Encouraged regular exercise and healthy diet.  Exam unremarkable. Labs pending. Follow up in 1 year for CPE.

## 2019-01-03 NOTE — Progress Notes (Signed)
Subjective:    Patient ID: Patricia Hicks, female    DOB: August 02, 1956, 63 y.o.   MRN: [id number]  HPI  Patricia Hicks is a 63 year old female who presents today for complete physical.  Immunizations: -Tetanus: Completed in 2013 -Influenza: Declines   -Shingles: Never completed    Diet: She endorses a healthy diet Breakfast: Fruit, greek yogurt, nuts; eggs with rye bread Lunch: Salad with vegetables, chick peas Dinner: Vegetables, grains, restaurants Snacks: Chips Desserts: 4-5 times weekly, small portions  Beverages: Water, tea, wine  Exercise: Yoga, walking Eye exam: Completed in 2019 Dental exam: Completes annually  Colonoscopy: Completed in November 2010 Pap Smear: Hysterectomy  Mammogram: Completed in 2019 Hep C Screen: Completed   Review of Systems  Constitutional: Negative for unexpected weight change.  HENT: Negative for rhinorrhea.   Respiratory: Negative for cough and shortness of breath.   Cardiovascular: Negative for chest pain.  Gastrointestinal: Negative for constipation and diarrhea.  Genitourinary: Negative for difficulty urinating.  Musculoskeletal:       Chronic arthritis to knees  Skin: Negative for rash.  Allergic/Immunologic: Negative for environmental allergies.  Neurological: Negative for dizziness, numbness and headaches.  Psychiatric/Behavioral: The patient is not nervous/anxious.        Past Medical History:  Diagnosis Date  . Arthritis   . Depression   . Diverticulosis    found via colonoscopy  . Internal hemorrhoids   . Rectal abscess   . Urinary tract infection      Social History   Socioeconomic History  . Marital status: Married    Spouse name: Not on file  . Number of children: 1  . Years of education: PhD  . Highest education level: Not on file  Occupational History  . Occupation: Professor    Associate Professor: [employer]    Comment: Licensed conveyancer for [employer]  Social Needs  . Financial resource strain: Not on file   . Food insecurity:    Worry: Not on file    Inability: Not on file  . Transportation needs:    Medical: Not on file    Non-medical: Not on file  Tobacco Use  . Smoking status: Former Smoker    Years: 20.00    Types: Cigarettes    Last attempt to quit: 12/08/1992    Years since quitting: 26.0  . Smokeless tobacco: Never Used  Substance and Sexual Activity  . Alcohol use: Yes    Comment: Occasional Wine  . Drug use: No  . Sexual activity: Not on file  Lifestyle  . Physical activity:    Days per week: Not on file    Minutes per session: Not on file  . Stress: Not on file  Relationships  . Social connections:    Talks on phone: Not on file    Gets together: Not on file    Attends religious service: Not on file    Active member of club or organization: Not on file    Attends meetings of clubs or organizations: Not on file    Relationship status: Not on file  . Intimate partner violence:    Fear of current or ex partner: Not on file    Emotionally abused: Not on file    Physically abused: Not on file    Forced sexual activity: Not on file  Other Topics Concern  . Not on file  Social History Narrative   Married.   1 child.   Works at [employer], teaches Social Work.  Enjoys gardening, walking her dog.     Past Surgical History:  Procedure Laterality Date  . ABDOMINAL HYSTERECTOMY  2002   Due to Fibroids  . EXCISION OF ADNEXAL MASS Right 04/2005  . OOPHORECTOMY Bilateral   . Rectal tear  2009   Rectal Abscess    Family History  Problem Relation Age of Onset  . Dementia Father   . Congestive Heart Failure Father   . Thyroid disease Sister   . Suicidality Brother   . Breast cancer Maternal Grandmother   . Breast cancer Mother [id number]       dx twice [id number] and [id number]  . Arrhythmia Sister   . Hypothyroidism Sister     Allergies  Allergen Reactions  . Sulfamethoxazole-Trimethoprim     No current outpatient medications on file prior to visit.   No current  facility-administered medications on file prior to visit.     BP 126/80   Pulse 67   Temp 98 F (36.7 C) (Oral)   Ht 5\' 4"  (1.626 m)   Wt 150 lb (68 kg)   SpO2 98%   BMI 25.75 kg/m    Objective:   Physical Exam  Constitutional: She is oriented to person, place, and time. She appears well-nourished.  HENT:  Mouth/Throat: No oropharyngeal exudate.  Eyes: Pupils are equal, round, and reactive to light. EOM are normal.  Neck: Neck supple. No thyromegaly present.  Cardiovascular: Normal rate and regular rhythm.  Respiratory: Effort normal and breath sounds normal.  GI: Soft. Bowel sounds are normal. There is no abdominal tenderness.  Musculoskeletal: Normal range of motion.  Neurological: She is alert and oriented to person, place, and time.  Skin: Skin is warm and dry.  Psychiatric: She has a normal mood and affect.           Assessment & Plan:

## 2019-01-03 NOTE — Assessment & Plan Note (Signed)
Repeat lipids pending.  

## 2019-01-10 DIAGNOSIS — Z1212 Encounter for screening for malignant neoplasm of rectum: Secondary | ICD-10-CM | POA: Diagnosis not present

## 2019-01-10 DIAGNOSIS — Z1211 Encounter for screening for malignant neoplasm of colon: Secondary | ICD-10-CM | POA: Diagnosis not present

## 2019-01-14 ENCOUNTER — Encounter: Payer: Self-pay | Admitting: Primary Care

## 2019-01-14 LAB — COLOGUARD
COLOGUARD: NEGATIVE
Cologuard: NEGATIVE

## 2019-01-17 ENCOUNTER — Ambulatory Visit
Admission: RE | Admit: 2019-01-17 | Discharge: 2019-01-17 | Disposition: A | Discharge status: Home / Self Care | Payer: BLUE CROSS/BLUE SHIELD | Source: Ambulatory Visit | Attending: Primary Care | Admitting: Primary Care

## 2019-01-17 DIAGNOSIS — Z1239 Encounter for other screening for malignant neoplasm of breast: Secondary | ICD-10-CM | POA: Diagnosis not present

## 2019-01-17 DIAGNOSIS — Z1231 Encounter for screening mammogram for malignant neoplasm of breast: Secondary | ICD-10-CM | POA: Diagnosis not present

## 2019-01-18 ENCOUNTER — Telehealth: Payer: Self-pay | Admitting: Primary Care

## 2019-01-18 NOTE — Telephone Encounter (Signed)
Please notify patient that her cologuard test is negative. We will repeat in 3 years. Document sent off for scanning.

## 2019-01-20 NOTE — Telephone Encounter (Signed)
Sending letter of Patricia Hicks's comments for patient.  

## 2019-02-03 ENCOUNTER — Ambulatory Visit (INDEPENDENT_AMBULATORY_CARE_PROVIDER_SITE_OTHER): Payer: BLUE CROSS/BLUE SHIELD

## 2019-02-03 DIAGNOSIS — I340 Nonrheumatic mitral (valve) insufficiency: Secondary | ICD-10-CM

## 2019-03-03 ENCOUNTER — Telehealth: Payer: BLUE CROSS/BLUE SHIELD | Admitting: Nurse Practitioner

## 2019-03-03 DIAGNOSIS — R059 Cough, unspecified: Secondary | ICD-10-CM

## 2019-03-03 DIAGNOSIS — R05 Cough: Secondary | ICD-10-CM

## 2019-03-03 MED ORDER — PROMETHAZINE-DM 6.25-15 MG/5ML PO SYRP: 5.0000 mL | ORAL_SOLUTION | Freq: Four times a day (QID) | ORAL | 0 refills | Status: DC | PRN

## 2019-03-03 NOTE — Progress Notes (Signed)

## 2019-03-09 ENCOUNTER — Telehealth: Payer: BLUE CROSS/BLUE SHIELD | Admitting: Family

## 2019-03-09 DIAGNOSIS — R05 Cough: Secondary | ICD-10-CM

## 2019-03-09 DIAGNOSIS — R059 Cough, unspecified: Secondary | ICD-10-CM

## 2019-03-09 DIAGNOSIS — Z7189 Other specified counseling: Secondary | ICD-10-CM

## 2019-03-09 DIAGNOSIS — R6889 Other general symptoms and signs: Secondary | ICD-10-CM

## 2019-03-09 MED ORDER — PROMETHAZINE-DM 6.25-15 MG/5ML PO SYRP: 5.0000 mL | ORAL_SOLUTION | Freq: Four times a day (QID) | ORAL | 0 refills | Status: AC | PRN

## 2019-03-09 MED ORDER — ALBUTEROL SULFATE HFA 108 (90 BASE) MCG/ACT IN AERS: 2.0000 | INHALATION_SPRAY | Freq: Four times a day (QID) | RESPIRATORY_TRACT | 0 refills | Status: DC | PRN

## 2019-03-09 NOTE — Progress Notes (Signed)
E-Visit for Corona Virus Screening  Based on your current symptoms, you may very well have the virus, however your symptoms are mild. Currently, not all patients are being tested. If the symptoms are mild and there is not a known exposure, performing the test is not indicated.   Continue to self isolate yourself, rest, drinking lots of fluids, good hand hygiene, and tylenol or motrin as needed. I sent in an albuterol inhaler that will help with the chest tightness.   Approximately 5 minutes was spent documenting and reviewing patient's chart.   Coronavirus disease 2019 (COVID-19) is a respiratory illness that can spread from person to person. The virus that causes COVID-19 is a new virus that was first identified in the country of [place] but is now found in multiple other countries and has spread to the [place].  Symptoms associated with the virus are mild to severe fever, cough, and shortness of breath. There is currently no vaccine to protect against COVID-19, and there is no specific antiviral treatment for the virus.   To be considered HIGH RISK for Coronavirus (COVID-19), you have to meet the following criteria:  . Traveled to [place], [place], [place], [place] or [place]; or in the [place] to [place], [place], [place], or [place]; and have fever, cough, and shortness of breath within the last 2 weeks of travel OR  . Been in close contact with a person diagnosed with COVID-19 within the last 2 weeks and have fever, cough, and shortness of breath  . IF YOU DO NOT MEET THESE CRITERIA, YOU ARE CONSIDERED LOW RISK FOR COVID-19.   It is vitally important that if you feel that you have an infection such as this virus or any other virus that you stay home and away from places where you may spread it to others.  You should self-quarantine for 14 days if you have symptoms that could potentially be coronavirus and avoid contact with people age 19 and older.   You can use medication such  as A prescription inhaler called Albuterol MDI 90 mcg /actuation 2 puffs every 4 hours as needed for shortness of breath, wheezing, cough and A prescription cough medication called Phenergan DM 6.25 mg/15 mg. You make take one teaspoon / 5 ml every 4-6 hours as needed for cough.  You may also take acetaminophen (Tylenol) as needed for fever.   Reduce your risk of any infection by using the same precautions used for avoiding the common cold or flu:  Marland Kitchen Wash your hands often with soap and warm water for at least 20 seconds.  If soap and water are not readily available, use an alcohol-based hand sanitizer with at least 60% alcohol.  . If coughing or sneezing, cover your mouth and nose by coughing or sneezing into the elbow areas of your shirt or coat, into a tissue or into your sleeve (not your hands). . Avoid shaking hands with others and consider head nods or verbal greetings only. . Avoid touching your eyes, nose, or mouth with unwashed hands.  . Avoid close contact with people who are sick. . Avoid places or events with large numbers of people in one location, like concerts or sporting events. . Carefully consider travel plans you have or are making. . If you are planning any travel outside or inside the [place], visit the CDC's Travelers' Health webpage for the latest health notices. . If you have some symptoms but not all symptoms, continue to monitor at home  and seek medical attention if your symptoms worsen. . If you are having a medical emergency, call 911.  HOME CARE . Only take medications as instructed by your medical team. . Drink plenty of fluids and get plenty of rest. . A steam or ultrasonic humidifier can help if you have congestion.   GET HELP RIGHT AWAY IF: . You develop worsening fever. . You become short of breath . You cough up blood. . Your symptoms become more severe MAKE SURE YOU   Understand these instructions.  Will watch your condition.  Will get help right away if  you are not doing well or get worse.  Your e-visit answers were reviewed by a board certified advanced clinical practitioner to complete your personal care plan.  Depending on the condition, your plan could have included both over the counter or prescription medications.  If there is a problem please reply once you have received a response from your provider. Your safety is important to [place].  If you have drug allergies check your prescription carefully.    You can use MyChart to ask questions about today's visit, request a non-urgent call back, or ask for a work or school excuse for 24 hours related to this e-Visit. If it has been greater than 24 hours you will need to follow up with your provider, or enter a new e-Visit to address those concerns. You will get an e-mail in the next two days asking about your experience.  I hope that your e-visit has been valuable and will speed your recovery. Thank you for using e-visits.

## 2019-03-27 ENCOUNTER — Other Ambulatory Visit: Payer: Self-pay | Admitting: Family

## 2019-06-29 DIAGNOSIS — S93601A Unspecified sprain of right foot, initial encounter: Secondary | ICD-10-CM | POA: Diagnosis not present

## 2019-07-26 ENCOUNTER — Other Ambulatory Visit: Payer: Self-pay

## 2019-07-26 ENCOUNTER — Encounter: Payer: Self-pay | Admitting: *Deleted

## 2019-07-26 DIAGNOSIS — R51 Headache: Secondary | ICD-10-CM | POA: Insufficient documentation

## 2019-07-26 DIAGNOSIS — M436 Torticollis: Secondary | ICD-10-CM | POA: Diagnosis not present

## 2019-07-26 DIAGNOSIS — Z79899 Other long term (current) drug therapy: Secondary | ICD-10-CM | POA: Diagnosis not present

## 2019-07-26 DIAGNOSIS — Z87891 Personal history of nicotine dependence: Secondary | ICD-10-CM | POA: Insufficient documentation

## 2019-07-26 DIAGNOSIS — M542 Cervicalgia: Secondary | ICD-10-CM | POA: Diagnosis not present

## 2019-07-26 MED ORDER — OXYCODONE-ACETAMINOPHEN 5-325 MG PO TABS
1.0000 | ORAL_TABLET | Freq: Once | ORAL | Status: AC
  Administered 2019-07-26: 1 via ORAL
  Filled 2019-07-26: qty 1

## 2019-07-26 NOTE — ED Triage Notes (Signed)
Pt to ED reporting she woke up with neck pain this morning that has worsened into a headache this afternoon. No known injury, no bruising and no swelling. Pt is not moving neck and appears to be very uncomfortable.   Pt reports yesterday she was feeling warmer than normal and generalized body aches that has since subsided.

## 2019-07-26 NOTE — ED Notes (Signed)
While swallowing medication pt reported feeling as though it was hard to swallow the pill, like it is just tight in there.

## 2019-07-27 ENCOUNTER — Encounter: Payer: Self-pay | Admitting: Radiology

## 2019-07-27 ENCOUNTER — Emergency Department: Payer: BC Managed Care – PPO

## 2019-07-27 ENCOUNTER — Emergency Department
Admission: EM | Admit: 2019-07-27 | Discharge: 2019-07-27 | Disposition: A | Discharge status: Home / Self Care | Payer: BC Managed Care – PPO | Attending: Emergency Medicine | Admitting: Emergency Medicine

## 2019-07-27 DIAGNOSIS — M542 Cervicalgia: Secondary | ICD-10-CM | POA: Diagnosis not present

## 2019-07-27 DIAGNOSIS — M436 Torticollis: Secondary | ICD-10-CM

## 2019-07-27 DIAGNOSIS — R51 Headache: Secondary | ICD-10-CM | POA: Diagnosis not present

## 2019-07-27 LAB — BASIC METABOLIC PANEL
Anion gap: 10 (ref 5–15)
BUN: 10 mg/dL (ref 8–23)
CO2: 26 mmol/L (ref 22–32)
Calcium: 9.3 mg/dL (ref 8.9–10.3)
Chloride: 102 mmol/L (ref 98–111)
Creatinine, Ser: 0.56 mg/dL (ref 0.44–1.00)
GFR calc Af Amer: >60 mL/min (ref >60)
GFR calc non Af Amer: >60 mL/min (ref >60)
Glucose, Bld: 127 mg/dL — ABNORMAL HIGH (ref 70–99)
Potassium: 4.2 mmol/L (ref 3.5–5.1)
Sodium: 138 mmol/L (ref 135–145)

## 2019-07-27 LAB — CBC
HCT: 40.4 % (ref 36.0–46.0)
Hemoglobin: 12.9 g/dL (ref 12.0–15.0)
MCH: 29 pg (ref 26.0–34.0)
MCHC: 31.9 g/dL (ref 30.0–36.0)
MCV: 90.8 fL (ref 80.0–100.0)
Platelets: 229 10*3/uL (ref 150–400)
RBC: 4.45 MIL/uL (ref 3.87–5.11)
RDW: 12.9 % (ref 11.5–15.5)
WBC: 9.7 10*3/uL (ref 4.0–10.5)
nRBC: 0 % (ref 0.0–0.2)

## 2019-07-27 MED ORDER — IOHEXOL 350 MG/ML SOLN
75.0000 mL | Freq: Once | INTRAVENOUS | Status: AC | PRN
  Administered 2019-07-27: 75 mL via INTRAVENOUS

## 2019-07-27 MED ORDER — OXYCODONE-ACETAMINOPHEN 5-325 MG PO TABS: 1.0000 | ORAL_TABLET | ORAL | 0 refills | Status: DC | PRN

## 2019-07-27 MED ORDER — DIAZEPAM 2 MG PO TABS: 2.0000 mg | ORAL_TABLET | Freq: Three times a day (TID) | ORAL | 0 refills | Status: DC | PRN

## 2019-07-27 MED ORDER — DIAZEPAM 2 MG PO TABS
2.0000 mg | ORAL_TABLET | Freq: Once | ORAL | Status: AC
  Administered 2019-07-27: 2 mg via ORAL
  Filled 2019-07-27: qty 1

## 2019-07-27 MED ORDER — KETOROLAC TROMETHAMINE 30 MG/ML IJ SOLN
15.0000 mg | Freq: Once | INTRAMUSCULAR | Status: AC
  Administered 2019-07-27: 15 mg via INTRAVENOUS
  Filled 2019-07-27: qty 1

## 2019-07-27 NOTE — ED Notes (Signed)
Patient transported to CT 

## 2019-07-27 NOTE — ED Provider Notes (Signed)
[facility] Emergency Department Provider Note  ____________________________________________  Time seen: Approximately 3:17 AM  I have reviewed the triage vital signs and the nursing notes.   HISTORY  Chief Complaint Neck Pain   HPI Patricia Hicks is a 63 y.o. female presents for evaluation of neck pain.  Patient reports that she woke up with neck pain this morning and has progressed throughout the day.  She reports that she is slept on a new pillow last night.   This morning when she woke up her neck was sore but that got progressively worse.  This evening the pain was severe, sharp, she is having difficulty turning her neck.  She is also complaining of a headache which she has had for most of the afternoon.  No fever, no photophobia, no trauma, no paresthesias of her extremities, no numbness or weakness of her extremities.  No thunderclap headache.  No history of aneurysms.  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 Authorizing Provider  albuterol (PROVENTIL HFA;VENTOLIN HFA) 108 (90 Base) MCG/ACT inhaler Inhale 2 puffs into the lungs  every 6 (six) hours as needed for wheezing or shortness of breath. 03/09/19   Sharion Balloon, FNP  diazepam (VALIUM) 2 MG tablet Take 1 tablet (2 mg total) by mouth every 8 (eight) hours as needed for anxiety. 07/27/19 07/26/20  Rudene Re, MD  oxyCODONE-acetaminophen (PERCOCET) 5-325 MG tablet Take 1 tablet by mouth every 4 (four) hours as needed. 07/27/19   Rudene Re, MD    Allergies Sulfamethoxazole-trimethoprim  Family History  Problem Relation Age of Onset   Dementia Father    Congestive Heart Failure Father    Thyroid disease Sister    Suicidality Brother    Breast cancer Maternal Grandmother    Breast cancer Mother 33       dx twice 88 and 35   Arrhythmia Sister    Hypothyroidism Sister     Social History Social History   Tobacco Use   Smoking status: Former Smoker    Years: 20.00    Types: Cigarettes    Quit date: 12/08/1992    Years since quitting: 26.6   Smokeless tobacco: Never Used  Substance Use Topics   Alcohol use: Yes    Comment: Occasional Wine   Drug use: No    Review of Systems  Constitutional: Negative for fever. Eyes: Negative for visual changes. ENT: Negative for sore throat. Neck: + neck pain  Cardiovascular: Negative for chest pain. Respiratory: Negative for shortness of breath. Gastrointestinal: Negative for abdominal pain, vomiting or diarrhea. Genitourinary: Negative for dysuria.  Musculoskeletal: Negative for back pain. Skin: Negative for rash. Neurological: Negative for headaches, weakness or numbness. Psych: No SI or HI  ____________________________________________   PHYSICAL EXAM:  VITAL SIGNS: ED Triage Vitals  Enc Vitals Group     BP 07/26/19 2229 (!) 164/87     Pulse Rate 07/26/19 2229 81     Resp 07/26/19 2229 16     Temp 07/26/19 2229 98.4 F (36.9 C)     Temp Source 07/26/19 2229 Oral     SpO2 07/26/19 2229 97 %     Weight 07/26/19 2230 158 lb (71.7 kg)     Height 07/26/19 2230 5\' 4"  (1.626  m)     Head Circumference --      Peak Flow --      Pain Score 07/26/19 2229 9     Pain Loc --      Pain Edu? --      Excl. in GC? --     Constitutional: Alert and oriented, sitting up and holding her neck in a neutral position, will not tunr the neck in any direction HEENT:      Head: Normocephalic and atraumatic.         Eyes: Conjunctivae are normal. Sclera is non-icteric.       Mouth/Throat: Mucous membranes are moist.       Neck: Decreased ROM due to pain, worse with side to side movement, no meningismus, no midline tenderness, tender to palpation of the trapezius muscle and cervical paraspinal muscles which are tense Cardiovascular: Regular rate and rhythm. No murmurs, gallops, or rubs. 2+ symmetrical distal pulses are present in all extremities.  Respiratory: Normal respiratory effort. Lungs are clear to auscultation bilaterally. No wheezes, crackles, or rhonchi.  Musculoskeletal: Nontender with normal range of motion in all extremities. No edema, cyanosis, or erythema of extremities. Neurologic: Normal speech and language. Face is symmetric. Moving all extremities. No gross focal neurologic deficits are appreciated. Skin: Skin is warm, dry and intact. No rash noted. Psychiatric: Mood and affect are normal. Speech and behavior are normal.  ____________________________________________   LABS (all labs ordered are listed, but only abnormal results are displayed)  Labs Reviewed  BASIC METABOLIC PANEL - Abnormal; Notable for the following components:      Result Value   Glucose, Bld 127 (*)    All other components within normal limits  CBC   ____________________________________________  EKG  none  ____________________________________________  RADIOLOGY  I have personally reviewed the images performed during this visit and I agree with the Radiologist's read.   Interpretation by Radiologist:  Ct Angio Head W Or Wo Contrast  Result Date: 07/27/2019 CLINICAL DATA:   Initial evaluation for acute neck pain, headache. EXAM: CT ANGIOGRAPHY HEAD AND NECK TECHNIQUE: Multidetector CT imaging of the head and neck was performed using the standard protocol during bolus administration of intravenous contrast. Multiplanar CT image reconstructions and MIPs were obtained to evaluate the vascular anatomy. Carotid stenosis measurements (when applicable) are obtained utilizing NASCET criteria, using the distal internal carotid diameter as the denominator. CONTRAST:  75mL OMNIPAQUE IOHEXOL 350 MG/ML SOLN COMPARISON:  Prior head CT from 11/20/2008. FINDINGS: CT HEAD FINDINGS Brain: Cerebral volume within normal limits for patient age. No evidence for acute intracranial hemorrhage. No findings to suggest acute large vessel territory infarct. No mass lesion, midline shift, or mass effect. Ventricles are normal in size without evidence for hydrocephalus. No extra-axial fluid collection identified. Vascular: No hyperdense vessel identified. Skull: Scalp soft tissues demonstrate  no acute abnormality. Calvarium intact. Sinuses/Orbits: Globes and orbital soft tissues within normal limits. Visualized paranasal sinuses are clear. No mastoid effusion. CTA NECK FINDINGS Aortic arch: Visualized aortic arch of normal caliber with normal 3 vessel morphology. No hemodynamically significant stenosis seen about the origin of the great vessels. Visualized subclavian arteries widely patent without abnormality. Right carotid system: Right common and internal carotid arteries widely patent without stenosis, dissection or occlusion. Left carotid system: Left common and internal carotid arteries widely patent without stenosis, dissection or occlusion. Vertebral arteries: Both vertebral arteries arise from the subclavian arteries. Left vertebral artery slightly dominant. Vertebral arteries widely patent within the neck without stenosis or other acute vascular abnormality. Skeleton: No acute osseous abnormality. No  discrete lytic or blastic osseous lesions. Congenital fusion of the C3 and C4 vertebral bodies noted. Moderate degenerative spondylolysis at C5-6 and C6-7. Other neck: No other acute soft tissue abnormality within the neck. Few scattered thyroid nodules noted, largest of which is peripherally calcified and measures 13 mm at the posterior left thyroid lobe, of doubtful significance given size and patient age. Upper chest: Visualized upper chest demonstrates no acute finding. Review of the MIP images confirms the above findings CTA HEAD FINDINGS Anterior circulation: Petrous segments widely patent bilaterally. Mild scattered plaque within the cavernous/supraclinoid ICAs without hemodynamically significant stenosis. A1 segments widely patent. Normal anterior communicating artery. Anterior cerebral arteries widely patent to their distal aspects without stenosis. No M1 stenosis or occlusion. Normal MCA bifurcations. Distal MCA branches well perfused and symmetric. Posterior circulation: Mild irregularity within the left vertebral artery as it courses into the cranial fall favored to be atherosclerotic in nature or possibly related to minimal changes of FMD. Vertebral arteries widely patent distally to the vertebrobasilar junction. Posterior inferior cerebral arteries patent bilaterally. Basilar diminutive but widely patent to its distal aspect. Superior cerebral arteries patent bilaterally. Predominant fetal type origin of the PCAs bilaterally, both of which are well perfused to their distal aspects. Venous sinuses: Patent. Anatomic variants: Predominant fetal type origin of the PCAs. No intracranial aneurysm. Review of the MIP images confirms the above findings IMPRESSION: 1. Negative CTA of the head and neck with no evidence for large vessel occlusion, hemodynamically significant stenosis, or other acute vascular abnormality. No intracranial aneurysm. 2. No other acute intracranial abnormality. 3. Moderate degenerative  spondylolysis at C5-6 and C6-7, with congenital fusion of the C3 and C4 vertebral bodies. Electronically Signed   By: Rise MuBenjamin  McClintock M.D.   On: 07/27/2019 02:25   Ct Angio Neck W And/or Wo Contrast  Result Date: 07/27/2019 CLINICAL DATA:  Initial evaluation for acute neck pain, headache. EXAM: CT ANGIOGRAPHY HEAD AND NECK TECHNIQUE: Multidetector CT imaging of the head and neck was performed using the standard protocol during bolus administration of intravenous contrast. Multiplanar CT image reconstructions and MIPs were obtained to evaluate the vascular anatomy. Carotid stenosis measurements (when applicable) are obtained utilizing NASCET criteria, using the distal internal carotid diameter as the denominator. CONTRAST:  75mL OMNIPAQUE IOHEXOL 350 MG/ML SOLN COMPARISON:  Prior head CT from 11/20/2008. FINDINGS: CT HEAD FINDINGS Brain: Cerebral volume within normal limits for patient age. No evidence for acute intracranial hemorrhage. No findings to suggest acute large vessel territory infarct. No mass lesion, midline shift, or mass effect. Ventricles are normal in size without evidence for hydrocephalus. No extra-axial fluid collection identified. Vascular: No hyperdense vessel identified. Skull: Scalp soft tissues demonstrate no acute abnormality. Calvarium intact. Sinuses/Orbits: Globes and orbital soft tissues within normal limits. Visualized  paranasal sinuses are clear. No mastoid effusion. CTA NECK FINDINGS Aortic arch: Visualized aortic arch of normal caliber with normal 3 vessel morphology. No hemodynamically significant stenosis seen about the origin of the great vessels. Visualized subclavian arteries widely patent without abnormality. Right carotid system: Right common and internal carotid arteries widely patent without stenosis, dissection or occlusion. Left carotid system: Left common and internal carotid arteries widely patent without stenosis, dissection or occlusion. Vertebral arteries:  Both vertebral arteries arise from the subclavian arteries. Left vertebral artery slightly dominant. Vertebral arteries widely patent within the neck without stenosis or other acute vascular abnormality. Skeleton: No acute osseous abnormality. No discrete lytic or blastic osseous lesions. Congenital fusion of the C3 and C4 vertebral bodies noted. Moderate degenerative spondylolysis at C5-6 and C6-7. Other neck: No other acute soft tissue abnormality within the neck. Few scattered thyroid nodules noted, largest of which is peripherally calcified and measures 13 mm at the posterior left thyroid lobe, of doubtful significance given size and patient age. Upper chest: Visualized upper chest demonstrates no acute finding. Review of the MIP images confirms the above findings CTA HEAD FINDINGS Anterior circulation: Petrous segments widely patent bilaterally. Mild scattered plaque within the cavernous/supraclinoid ICAs without hemodynamically significant stenosis. A1 segments widely patent. Normal anterior communicating artery. Anterior cerebral arteries widely patent to their distal aspects without stenosis. No M1 stenosis or occlusion. Normal MCA bifurcations. Distal MCA branches well perfused and symmetric. Posterior circulation: Mild irregularity within the left vertebral artery as it courses into the cranial fall favored to be atherosclerotic in nature or possibly related to minimal changes of FMD. Vertebral arteries widely patent distally to the vertebrobasilar junction. Posterior inferior cerebral arteries patent bilaterally. Basilar diminutive but widely patent to its distal aspect. Superior cerebral arteries patent bilaterally. Predominant fetal type origin of the PCAs bilaterally, both of which are well perfused to their distal aspects. Venous sinuses: Patent. Anatomic variants: Predominant fetal type origin of the PCAs. No intracranial aneurysm. Review of the MIP images confirms the above findings IMPRESSION: 1.  Negative CTA of the head and neck with no evidence for large vessel occlusion, hemodynamically significant stenosis, or other acute vascular abnormality. No intracranial aneurysm. 2. No other acute intracranial abnormality. 3. Moderate degenerative spondylolysis at C5-6 and C6-7, with congenital fusion of the C3 and C4 vertebral bodies. Electronically Signed   By: Rise MuBenjamin  McClintock M.D.   On: 07/27/2019 02:25     ____________________________________________   PROCEDURES  Procedure(s) performed: None Procedures Critical Care performed:  None ____________________________________________   INITIAL IMPRESSION / ASSESSMENT AND PLAN / ED COURSE   63 y.o. female presents for evaluation of neck pain and stiffness that started after she slept on a new pillow last night. Patient is sitting up holding her neck in a neutral position, has significant tenderness of the trapezius and paraspinal muscles of the cervical area, muscles feel tense, she is neurovascularly intact.  CT Angie of the head and neck with no evidence of dissection.  Presentation concerning for torticollis.  Patient was given Toradol, Valium, and 1 Percocet with significant relief of her pain.  Will send home on standing ibuprofen, as needed Percocet, as needed Valium, heat, range of motion exercises, follow-up with PCP.  Discussed my standard return precautions.       As part of my medical decision making, I reviewed the following data within the electronic MEDICAL RECORD NUMBER Nursing notes reviewed and incorporated, Labs reviewed , Old chart reviewed, Radiograph reviewed , Notes from prior ED  visits and  Controlled Substance Database   Patient was evaluated in Emergency Department today for the symptoms described in the history of present illness. Patient was evaluated in the context of the global COVID-19 pandemic, which necessitated consideration that the patient might be at risk for infection with the SARS-CoV-2 virus that causes  COVID-19. Institutional protocols and algorithms that pertain to the evaluation of patients at risk for COVID-19 are in a state of rapid change based on information released by regulatory bodies including the CDC and federal and state organizations. These policies and algorithms were followed during the patient's care in the ED.   ____________________________________________   FINAL CLINICAL IMPRESSION(S) / ED DIAGNOSES   Final diagnoses:  Torticollis, acute      NEW MEDICATIONS STARTED DURING THIS VISIT:  ED Discharge Orders         Ordered    diazepam (VALIUM) 2 MG tablet  Every 8 hours PRN     07/27/19 0315    oxyCODONE-acetaminophen (PERCOCET) 5-325 MG tablet  Every 4 hours PRN     07/27/19 0315           Note:  This document was prepared using Dragon voice recognition software and may include unintentional dictation errors.    Don Perking, [place], MD 07/27/19 [phone]

## 2019-07-27 NOTE — Discharge Instructions (Addendum)
Take ibuprofen 600mg  every 6 hours with food. Apply heat several times a day. Do range of motion exercises with your neck as often as possible. For severe breakthrough pain take 1 oxycodone every 6 hours. At bedtime, take one valium as a muscle relaxant to help you sleep. Do not take valium during the day. Do not take it within 1 hour of taking percocet, do not drive, operate heavy machinery or drink alcohol while on valium. Follow up with your doctor in 2 days. Return to the ER for worsening pain, fever, vomiting, weakness or numbness of your arms or legs.

## 2019-08-20 DIAGNOSIS — Z23 Encounter for immunization: Secondary | ICD-10-CM | POA: Diagnosis not present

## 2020-03-12 ENCOUNTER — Other Ambulatory Visit: Payer: Self-pay | Admitting: Primary Care

## 2020-03-12 DIAGNOSIS — Z1231 Encounter for screening mammogram for malignant neoplasm of breast: Secondary | ICD-10-CM

## 2020-04-23 ENCOUNTER — Ambulatory Visit
Admission: RE | Admit: 2020-04-23 | Discharge: 2020-04-23 | Disposition: A | Discharge status: Home / Self Care | Payer: BC Managed Care – PPO | Source: Ambulatory Visit | Attending: Primary Care | Admitting: Primary Care

## 2020-04-23 DIAGNOSIS — Z1231 Encounter for screening mammogram for malignant neoplasm of breast: Secondary | ICD-10-CM

## 2020-04-24 ENCOUNTER — Other Ambulatory Visit: Payer: Self-pay | Admitting: Primary Care

## 2020-04-24 DIAGNOSIS — E538 Deficiency of other specified B group vitamins: Secondary | ICD-10-CM

## 2020-04-24 DIAGNOSIS — E782 Mixed hyperlipidemia: Secondary | ICD-10-CM

## 2020-04-24 DIAGNOSIS — E559 Vitamin D deficiency, unspecified: Secondary | ICD-10-CM

## 2020-04-24 DIAGNOSIS — Z114 Encounter for screening for human immunodeficiency virus [HIV]: Secondary | ICD-10-CM

## 2020-04-25 ENCOUNTER — Other Ambulatory Visit (INDEPENDENT_AMBULATORY_CARE_PROVIDER_SITE_OTHER): Payer: BC Managed Care – PPO

## 2020-04-25 DIAGNOSIS — E559 Vitamin D deficiency, unspecified: Secondary | ICD-10-CM | POA: Diagnosis not present

## 2020-04-25 DIAGNOSIS — E782 Mixed hyperlipidemia: Secondary | ICD-10-CM | POA: Diagnosis not present

## 2020-04-25 DIAGNOSIS — Z114 Encounter for screening for human immunodeficiency virus [HIV]: Secondary | ICD-10-CM

## 2020-04-25 DIAGNOSIS — E538 Deficiency of other specified B group vitamins: Secondary | ICD-10-CM | POA: Diagnosis not present

## 2020-04-25 LAB — COMPREHENSIVE METABOLIC PANEL
ALT: 14 U/L (ref 0–35)
AST: 17 U/L (ref 0–37)
Albumin: 4.2 g/dL (ref 3.5–5.2)
Alkaline Phosphatase: 63 U/L (ref 39–117)
BUN: 10 mg/dL (ref 6–23)
CO2: 28 mEq/L (ref 19–32)
Calcium: 9.5 mg/dL (ref 8.4–10.5)
Chloride: 102 mEq/L (ref 96–112)
Creatinine, Ser: 0.71 mg/dL (ref 0.40–1.20)
GFR: 82.66 mL/min (ref >60.00)
Glucose, Bld: 101 mg/dL — ABNORMAL HIGH (ref 70–99)
Potassium: 4 mEq/L (ref 3.5–5.1)
Sodium: 137 mEq/L (ref 135–145)
Total Bilirubin: 0.7 mg/dL (ref 0.2–1.2)
Total Protein: 6.8 g/dL (ref 6.0–8.3)

## 2020-04-25 LAB — VITAMIN B12: Vitamin B-12: 383 pg/mL (ref 211–911)

## 2020-04-25 LAB — LIPID PANEL
Cholesterol: 223 mg/dL — ABNORMAL HIGH (ref 0–200)
HDL: 70 mg/dL (ref >39.00)
LDL Cholesterol: 136 mg/dL — ABNORMAL HIGH (ref 0–99)
NonHDL: 152.82
Total CHOL/HDL Ratio: 3
Triglycerides: 86 mg/dL (ref 0.0–149.0)
VLDL: 17.2 mg/dL (ref 0.0–40.0)

## 2020-04-25 LAB — VITAMIN D 25 HYDROXY (VIT D DEFICIENCY, FRACTURES): VITD: 56.16 ng/mL (ref 30.00–100.00)

## 2020-04-26 LAB — HIV ANTIBODY (ROUTINE TESTING W REFLEX): HIV 1&2 Ab, 4th Generation: NONREACTIVE

## 2020-05-02 ENCOUNTER — Ambulatory Visit (INDEPENDENT_AMBULATORY_CARE_PROVIDER_SITE_OTHER): Payer: BC Managed Care – PPO | Admitting: Primary Care

## 2020-05-02 ENCOUNTER — Other Ambulatory Visit: Payer: Self-pay

## 2020-05-02 ENCOUNTER — Encounter: Payer: Self-pay | Admitting: Primary Care

## 2020-05-02 VITALS — BP 120/76 | HR 76 | Temp 96.5°F | Ht 64.0 in | Wt 151.0 lb

## 2020-05-02 DIAGNOSIS — E538 Deficiency of other specified B group vitamins: Secondary | ICD-10-CM | POA: Diagnosis not present

## 2020-05-02 DIAGNOSIS — E559 Vitamin D deficiency, unspecified: Secondary | ICD-10-CM

## 2020-05-02 DIAGNOSIS — E785 Hyperlipidemia, unspecified: Secondary | ICD-10-CM

## 2020-05-02 DIAGNOSIS — Z0000 Encounter for general adult medical examination without abnormal findings: Secondary | ICD-10-CM | POA: Diagnosis not present

## 2020-05-02 DIAGNOSIS — M1990 Unspecified osteoarthritis, unspecified site: Secondary | ICD-10-CM | POA: Diagnosis not present

## 2020-05-02 NOTE — Patient Instructions (Addendum)
Please call or message me the strength of your vitamin B12 and D. Make sure to add calcium 1200 mg daily.  Start exercising. You should be getting 150 minutes of moderate intensity exercise weekly.  Continue to work on a healthy diet. Ensure you are consuming 64 ounces of water daily.  It was a pleasure to see you today!   Preventive Care 49-65 Years Old, Female Preventive care refers to visits with your health care provider and lifestyle choices that can promote health and wellness. This includes:  A yearly physical exam. This may also be called an annual well check.  Regular dental visits and eye exams.  Immunizations.  Screening for certain conditions.  Healthy lifestyle choices, such as eating a healthy diet, getting regular exercise, not using drugs or products that contain nicotine and tobacco, and limiting alcohol use. What can I expect for my preventive care visit? Physical exam Your health care provider will check your:  Height and weight. This may be used to calculate body mass index (BMI), which tells if you are at a healthy weight.  Heart rate and blood pressure.  Skin for abnormal spots. Counseling Your health care provider may ask you questions about your:  Alcohol, tobacco, and drug use.  Emotional well-being.  Home and relationship well-being.  Sexual activity.  Eating habits.  Work and work Statistician.  Method of birth control.  Menstrual cycle.  Pregnancy history. What immunizations do I need?  Influenza (flu) vaccine  This is recommended every year. Tetanus, diphtheria, and pertussis (Tdap) vaccine  You may need a Td booster every 10 years. Varicella (chickenpox) vaccine  You may need this if you have not been vaccinated. Zoster (shingles) vaccine  You may need this after age 71. Measles, mumps, and rubella (MMR) vaccine  You may need at least one dose of MMR if you were born in 1957 or later. You may also need a second  dose. Pneumococcal conjugate (PCV13) vaccine  You may need this if you have certain conditions and were not previously vaccinated. Pneumococcal polysaccharide (PPSV23) vaccine  You may need one or two doses if you smoke cigarettes or if you have certain conditions. Meningococcal conjugate (MenACWY) vaccine  You may need this if you have certain conditions. Hepatitis A vaccine  You may need this if you have certain conditions or if you travel or work in places where you may be exposed to hepatitis A. Hepatitis B vaccine  You may need this if you have certain conditions or if you travel or work in places where you may be exposed to hepatitis B. Haemophilus influenzae type b (Hib) vaccine  You may need this if you have certain conditions. Human papillomavirus (HPV) vaccine  If recommended by your health care provider, you may need three doses over 6 months. You may receive vaccines as individual doses or as more than one vaccine together in one shot (combination vaccines). Talk with your health care provider about the risks and benefits of combination vaccines. What tests do I need? Blood tests  Lipid and cholesterol levels. These may be checked every 5 years, or more frequently if you are over 62 years old.  Hepatitis C test.  Hepatitis B test. Screening  Lung cancer screening. You may have this screening every year starting at age 65 if you have a 30-pack-year history of smoking and currently smoke or have quit within the past 15 years.  Colorectal cancer screening. All adults should have this screening starting at age 58  and continuing until age 64. Your health care provider may recommend screening at age 67 if you are at increased risk. You will have tests every 1-10 years, depending on your results and the type of screening test.  Diabetes screening. This is done by checking your blood sugar (glucose) after you have not eaten for a while (fasting). You may have this done every  1-3 years.  Mammogram. This may be done every 1-2 years. Talk with your health care provider about when you should start having regular mammograms. This may depend on whether you have a family history of breast cancer.  BRCA-related cancer screening. This may be done if you have a family history of breast, ovarian, tubal, or peritoneal cancers.  Pelvic exam and Pap test. This may be done every 3 years starting at age 58. Starting at age 4, this may be done every 5 years if you have a Pap test in combination with an HPV test. Other tests  Sexually transmitted disease (STD) testing.  Bone density scan. This is done to screen for osteoporosis. You may have this scan if you are at high risk for osteoporosis. Follow these instructions at home: Eating and drinking  Eat a diet that includes fresh fruits and vegetables, whole grains, lean protein, and low-fat dairy.  Take vitamin and mineral supplements as recommended by your health care provider.  Do not drink alcohol if: ? Your health care provider tells you not to drink. ? You are pregnant, may be pregnant, or are planning to become pregnant.  If you drink alcohol: ? Limit how much you have to 0-1 drink a day. ? Be aware of how much alcohol is in your drink. In the [place] one drink equals one 12 oz bottle of beer (355 mL), one 5 oz glass of wine (148 mL), or one 1 oz glass of hard liquor (44 mL). Lifestyle  Take daily care of your teeth and gums.  Stay active. Exercise for at least 30 minutes on 5 or more days each week.  Do not use any products that contain nicotine or tobacco, such as cigarettes, e-cigarettes, and chewing tobacco. If you need help quitting, ask your health care provider.  If you are sexually active, practice safe sex. Use a condom or other form of birth control (contraception) in order to prevent pregnancy and STIs (sexually transmitted infections).  If told by your health care provider, take low-dose aspirin daily  starting at age 36. What's next?  Visit your health care provider once a year for a well check visit.  Ask your health care provider how often you should have your eyes and teeth checked.  Stay up to date on all vaccines. This information is not intended to replace advice given to you by your health care provider. Make sure you discuss any questions you have with your health care provider. Document Revised: 08/05/2018 Document Reviewed: 08/05/2018 Elsevier Patient Education  2020 [employer].

## 2020-05-02 NOTE — Assessment & Plan Note (Signed)
Immunizations UTD. Colon cancer screening UTD. Mammogram UTD. Bone density scan due at 65. Encouraged a healthy diet and regular exercise. Exam today unremarkable. Labs reviewed.

## 2020-05-02 NOTE — Progress Notes (Signed)
Subjective:    Patient ID: Patricia Hicks, female    DOB: [birth year], 64 y.o.   MRN: [id number]  HPI  This visit occurred during the SARS-CoV-2 public health emergency.  Safety protocols were in place, including screening questions prior to the visit, additional usage of staff PPE, and extensive cleaning of exam room while observing appropriate contact time as indicated for disinfecting solutions.   Patricia Hicks is a 64 year old female who presents today for complete physical.  Immunizations: -Tetanus: Completed in 2013 -Influenza: Completed last season  -Shingles: Completed Shingrix -Pneumonia: Never completed  -Covid-19: Completed series   Diet: She endorses a healthy diet.  Exercise: She walks daily, active at home.   Eye exam: Completed in 2020 Dental exam: No recent exam  Pap Smear: Hysterectomy  Mammogram: Completed in May 2021 Colonoscopy: Completed in 2010. Cologuard negative in 2020 Hep C Screen: Negative  BP Readings from Last 3 Encounters:  05/02/20 120/76  07/27/19 (!) 142/78  01/03/19 126/80   Wt Readings from Last 3 Encounters:  05/02/20 151 lb (68.5 kg)  07/26/19 158 lb (71.7 kg)  01/03/19 150 lb (68 kg)   The 10-year ASCVD risk score Denman George DC Jr., et al., 2013) is: 4.2%   Values used to calculate the score:     Age: 13 years     Sex: Female     Is Non-Hispanic African American: No     Diabetic: No     Tobacco smoker: No     Systolic Blood Pressure: 120 mmHg     Is BP treated: No     HDL Cholesterol: 70 mg/dL     Total Cholesterol: 223 mg/dL   Review of Systems  Constitutional: Negative for unexpected weight change.  HENT: Negative for rhinorrhea.   Respiratory: Negative for cough and shortness of breath.   Cardiovascular: Negative for chest pain.  Gastrointestinal: Negative for constipation and diarrhea.  Genitourinary: Negative for difficulty urinating.  Musculoskeletal: Positive for arthralgias.  Skin: Negative for rash.    Allergic/Immunologic: Negative for environmental allergies.  Neurological: Negative for dizziness and headaches.  Psychiatric/Behavioral: The patient is not nervous/anxious.        Past Medical History:  Diagnosis Date  . Arthritis   . Depression   . Diverticulosis    found via colonoscopy  . Internal hemorrhoids   . Rectal abscess   . Urinary tract infection      Social History   Socioeconomic History  . Marital status: Married    Spouse name: Not on file  . Number of children: 1  . Years of education: PhD  . Highest education level: Not on file  Occupational History  . 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 Smoking status: Former Smoker    Years: 20.00    Types: Cigarettes    Quit date: 12/08/1992    Years since quitting: 27.4  . Smokeless tobacco: Never Used  Substance and Sexual Activity  . Alcohol use: Yes    Comment: Occasional Wine  . Drug use: No  . Sexual activity: Not on file  Other Topics Concern  . Not on file  Social History Narrative   Married.   1 child.   Works at [employer], teaches Social Work.   Enjoys gardening, walking her dog.    Social Determinants of Health   Financial Resource Strain:   . Difficulty of Paying Living Expenses:  Food Insecurity:   . Worried About Charity fundraiser in the Last Year:   . Arboriculturist in the Last Year:   Transportation Needs:   . Film/video editor (Medical):   Marland Kitchen Lack of Transportation (Non-Medical):   Physical Activity:   . Days of Exercise per Week:   . Minutes of Exercise per Session:   Stress:   . Feeling of Stress :   Social Connections:   . Frequency of Communication with Friends and Family:   . Frequency of Social Gatherings with Friends and Family:   . Attends Religious Services:   . Active Member of Clubs or Organizations:   . Attends Archivist Meetings:   Marland Kitchen Marital Status:   Intimate Partner  Violence:   . Fear of Current or Ex-Partner:   . Emotionally Abused:   Marland Kitchen Physically Abused:   . Sexually Abused:     Past Surgical History:  Procedure Laterality Date  . ABDOMINAL HYSTERECTOMY  2002   Due to Fibroids  . EXCISION OF ADNEXAL MASS Right 04/2005  . OOPHORECTOMY Bilateral   . Rectal tear  2009   Rectal Abscess    Family History  Problem Relation Age of Onset  . Dementia Father   . Congestive Heart Failure Father   . Thyroid disease Sister   . Suicidality Brother   . Breast cancer Maternal Grandmother   . Breast cancer Mother 5       dx twice 77 and 36  . Arrhythmia Sister   . Hypothyroidism Sister     Allergies  Allergen Reactions  . Sulfamethoxazole-Trimethoprim     No current outpatient medications on file prior to visit.   No current facility-administered medications on file prior to visit.    BP 120/76   Pulse 76   Temp (!) 96.5 F (35.8 C) (Temporal)   Ht 5\' 4"  (1.626 m)   Wt 151 lb (68.5 kg)   SpO2 98%   BMI 25.92 kg/m    Objective:   Physical Exam  Constitutional: She is oriented to person, place, and time. She appears well-nourished.  HENT:  Right Ear: Tympanic membrane and ear canal normal.  Left Ear: Tympanic membrane and ear canal normal.  Mouth/Throat: Oropharynx is clear and moist.  Eyes: Pupils are equal, round, and reactive to light. EOM are normal.  Cardiovascular: Normal rate and regular rhythm.  Respiratory: Effort normal and breath sounds normal.  GI: Soft. Bowel sounds are normal. There is no abdominal tenderness.  Musculoskeletal:        General: Normal range of motion.     Cervical back: Neck supple.  Neurological: She is alert and oriented to person, place, and time. No cranial nerve deficit.  Reflex Scores:      Patellar reflexes are 2+ on the right side and 2+ on the left side. Skin: Skin is warm and dry.  Psychiatric: She has a normal mood and affect.           Assessment & Plan:

## 2020-05-02 NOTE — Assessment & Plan Note (Signed)
Compliant to vitamin D daily, recent level at goal. Continue same.

## 2020-05-02 NOTE — Assessment & Plan Note (Signed)
LDL with an increase to 136 on recent labs. Encouraged regular exercise.

## 2020-05-02 NOTE — Assessment & Plan Note (Signed)
Compliant to vitamin B 12, recent level stable.

## 2020-05-02 NOTE — Assessment & Plan Note (Signed)
Chronic but overall manges well.  Encouraged regular activity, weight loss.

## 2020-05-08 NOTE — Telephone Encounter (Signed)
Patricia Hicks, please add to patient's medication list.

## 2020-05-11 NOTE — Telephone Encounter (Signed)
Noted. Updated.

## 2020-11-12 ENCOUNTER — Other Ambulatory Visit: Payer: Self-pay

## 2020-11-12 ENCOUNTER — Encounter: Payer: Self-pay | Admitting: Primary Care

## 2020-11-12 ENCOUNTER — Ambulatory Visit (INDEPENDENT_AMBULATORY_CARE_PROVIDER_SITE_OTHER): Payer: Medicare Other | Admitting: Primary Care

## 2020-11-12 VITALS — BP 134/82 | HR 61 | Temp 98.6°F | Ht 64.0 in | Wt 157.0 lb

## 2020-11-12 DIAGNOSIS — L0291 Cutaneous abscess, unspecified: Secondary | ICD-10-CM | POA: Diagnosis not present

## 2020-11-12 MED ORDER — DOXYCYCLINE HYCLATE 100 MG PO TABS: 100.0000 mg | ORAL_TABLET | Freq: Two times a day (BID) | ORAL | 0 refills | Status: DC

## 2020-11-12 NOTE — Assessment & Plan Note (Signed)
Apparent on exam today. Non fluctuant so unable to perform I&D.   Rx for Doxycycline course sent to pharmacy. She will update later this week.

## 2020-11-12 NOTE — Patient Instructions (Signed)
Start Doxycycline antibiotic for the infection. Take 1 tablet by mouth twice daily for 7 days.  This will make your skin more sensitive to direct sunlight.   Please update me Friday this week.  It was a pleasure to see you today!

## 2020-11-12 NOTE — Telephone Encounter (Signed)
Patient called stating that she sent a mychart earlier this morning and wants to know what she should do? .Patient stated that she has a cyst on her back and it may be infected and she may need an antibiotic. Patient scheduled to see Dr. Patsy Lager today 11/12/20 at 12:00 noon. Mayra Reel NP does not have any appointment available today.

## 2020-11-12 NOTE — Progress Notes (Signed)
Subjective:    Patient ID: Patricia Hicks, female    DOB: [birth year], 64 y.o.   MRN: [id number]  HPI  This visit occurred during the SARS-CoV-2 public health emergency.  Safety protocols were in place, including screening questions prior to the visit, additional usage of staff PPE, and extensive cleaning of exam room while observing appropriate contact time as indicated for disinfecting solutions.   Patricia Hicks is a 64 year old female with a history of ashy dermatosis of Derrell Lolling, chondromalacia who presents today with a chief complaint of skin cyst.  History of intermittent skin cysts which historically resolve without intervention.   One week ago noticed return in a cyst to the left posterior shoulder. This cyst occurred about one year ago and required treatment with antibiotics. The cyst this time is very painful. She's been soaking the cyst with warm water and about two days ago noticed an opening of the cyst with green/whitish drainage. Since then she's noticed some drainage but is worried about the pain and warmth.   She denies fevers.   Review of Systems  Constitutional: Negative for fever.  Skin: Positive for color change and wound.       Past Medical History:  Diagnosis Date  . Arthritis   . Depression   . Disruption of rectum (HCC) 07/16/2015  . Diverticulosis    found via colonoscopy  . Internal hemorrhoids   . Rectal abscess   . Urinary tract infection      Social History   Socioeconomic History  . Marital status: Married    Spouse name: Not on file  . Number of children: 1  . Years of education: PhD  . Highest education level: Not on file  Occupational History  . 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 Smoking status: Former Smoker    Years: 20.00    Types: Cigarettes    Quit date: 12/08/1992    Years since quitting: 27.9  . Smokeless tobacco: Never Used  Substance and Sexual Activity   . Alcohol use: Yes    Comment: Occasional Wine  . Drug use: No  . Sexual activity: Not on file  Other Topics Concern  . Not on file  Social History Narrative   Married.   1 child.   Works at [employer], teaches Social Work.   Enjoys gardening, walking her dog.    Social Determinants of Health   Financial Resource Strain:   . Difficulty of Paying Living Expenses: Not on file  Food Insecurity:   . Worried About Programme researcher, broadcasting/film/video in the Last Year: Not on file  . Ran Out of Food in the Last Year: Not on file  Transportation Needs:   . Lack of Transportation (Medical): Not on file  . Lack of Transportation (Non-Medical): Not on file  Physical Activity:   . Days of Exercise per Week: Not on file  . Minutes of Exercise per Session: Not on file  Stress:   . Feeling of Stress : Not on file  Social Connections:   . Frequency of Communication with Friends and Family: Not on file  . Frequency of Social Gatherings with Friends and Family: Not on file  . Attends Religious Services: Not on file  . Active Member of Clubs or Organizations: Not on file  . Attends Banker Meetings: Not on file  . Marital Status: Not on file  Intimate Partner  Violence:   . Fear of Current or Ex-Partner: Not on file  . Emotionally Abused: Not on file  . Physically Abused: Not on file  . Sexually Abused: Not on file    Past Surgical History:  Procedure Laterality Date  . ABDOMINAL HYSTERECTOMY  2002   Due to Fibroids  . EXCISION OF ADNEXAL MASS Right 04/2005  . OOPHORECTOMY Bilateral   . Rectal tear  2009   Rectal Abscess    Family History  Problem Relation Age of Onset  . Dementia Father   . Congestive Heart Failure Father   . Thyroid disease Sister   . Suicidality Brother   . Breast cancer Maternal Grandmother   . Breast cancer Mother 87       dx twice 42 and 4  . Arrhythmia Sister   . Hypothyroidism Sister     Allergies  Allergen Reactions  . Sulfamethoxazole-Trimethoprim      Current Outpatient Medications on File Prior to Visit  Medication Sig Dispense Refill  . UNABLE TO FIND Take 1 capsule by mouth daily. Med Name: Natuerlo Whole Food Multivitamin for Women 50+    . UNABLE TO FIND Take 1 capsule by mouth daily. Med Name: Natuerlo B-Complex with CoQ10    . UNABLE TO FIND Take 1 capsule by mouth daily. Med Name: Natuerlo Vegan B12    . UNABLE TO FIND Take 1 capsule by mouth daily. Med Name: Naturelo Vitamin D3    . UNABLE TO FIND Take 1 capsule by mouth daily. Med Name: Natuerlo Turmeric & Ginger Root     No current facility-administered medications on file prior to visit.    BP 134/82   Pulse 61   Temp 98.6 F (37 C) (Temporal)   Ht 5\' 4"  (1.626 m)   Wt 157 lb (71.2 kg)   SpO2 98%   BMI 26.95 kg/m    Objective:   Physical Exam Pulmonary:     Effort: Pulmonary effort is normal.  Skin:    General: Skin is warm and dry.     Findings: Erythema present.     Comments: 1 cm raised, soft (non fluctuant), cyst appearing mass to left posterior shoulder with moderate erythema and tenderness. No drainage. Appears infectious.   Neurological:     Mental Status: She is alert.            Assessment & Plan:

## 2020-11-13 ENCOUNTER — Ambulatory Visit: Payer: BC Managed Care – PPO | Admitting: Primary Care

## 2020-11-28 DIAGNOSIS — Z03818 Encounter for observation for suspected exposure to other biological agents ruled out: Secondary | ICD-10-CM | POA: Diagnosis not present

## 2021-03-27 ENCOUNTER — Encounter: Payer: Self-pay | Admitting: Primary Care

## 2021-03-27 ENCOUNTER — Ambulatory Visit (INDEPENDENT_AMBULATORY_CARE_PROVIDER_SITE_OTHER): Payer: Medicare Other | Admitting: Primary Care

## 2021-03-27 ENCOUNTER — Other Ambulatory Visit: Payer: Self-pay

## 2021-03-27 VITALS — BP 138/80 | HR 78 | Temp 98.3°F | Ht 64.0 in | Wt 160.0 lb

## 2021-03-27 DIAGNOSIS — M1990 Unspecified osteoarthritis, unspecified site: Secondary | ICD-10-CM

## 2021-03-27 DIAGNOSIS — E785 Hyperlipidemia, unspecified: Secondary | ICD-10-CM | POA: Diagnosis not present

## 2021-03-27 DIAGNOSIS — E2839 Other primary ovarian failure: Secondary | ICD-10-CM

## 2021-03-27 DIAGNOSIS — M2550 Pain in unspecified joint: Secondary | ICD-10-CM

## 2021-03-27 DIAGNOSIS — R0789 Other chest pain: Secondary | ICD-10-CM | POA: Diagnosis not present

## 2021-03-27 DIAGNOSIS — Z1231 Encounter for screening mammogram for malignant neoplasm of breast: Secondary | ICD-10-CM | POA: Diagnosis not present

## 2021-03-27 DIAGNOSIS — I340 Nonrheumatic mitral (valve) insufficiency: Secondary | ICD-10-CM | POA: Diagnosis not present

## 2021-03-27 HISTORY — DX: Other chest pain: R07.89

## 2021-03-27 NOTE — Progress Notes (Signed)
Subjective:    Patient ID: Patricia Hicks, female    DOB: 11-Nov-1956, 65 y.o.   MRN: [id number]  HPI  Patricia Hicks is a very pleasant 65 y.o. female with a history of arthritis, chondromalacia, vitamin B12 deficiency, vitamin D who presents today with a chief complaint of joint pain and chest pressure.   Her joint pain is generalized to the entire body, has been chronic for years, worse over the last several days. Increased pain is located to the left lateral trunk under axilla, left posterior shoulder. Yesterday she noticed anterior chest pressure with some belching and nausea, this has been intermittent since.  She's been working in the garden doing strenuous work for the last 4-5 days. Since symptoms began she's reduced her activity level. Typically for exercise she does yoga daily and walks her dog daily.   Several years ago she presented to the ED for chest pain, underwent work up for which was negative for cardiac cause. She underwent repeat echocardiogram in 2020 for previously diagnosed mitral incompetence, this showed mild mitral valve regurgitation, otherwise negative.   She has been checking her BP at home and is getting readings of 130's/70's.   She continues to notice ongoing joint pain to her knees, hips, lower back, toes, ankles, shoulders, hands, neck. She underwent CT scan of her neck in 2020 which showed moderate arthritis. She's not had autoimmune work up, denies a family history of RA. She's become very frustrated with her pain as she's now retired and would like to become more active.   BP Readings from Last 3 Encounters:  03/27/21 138/80  11/12/20 134/82  05/02/20 120/76   Wt Readings from Last 3 Encounters:  03/27/21 160 lb (72.6 kg)  11/12/20 157 lb (71.2 kg)  05/02/20 151 lb (68.5 kg)      Review of Systems  Eyes: Negative for visual disturbance.  Cardiovascular:       Chest pressure  Gastrointestinal:       Belching, nausea  Musculoskeletal: Positive for  arthralgias and back pain. Negative for joint swelling.  Skin: Negative for color change.  Neurological: Negative for headaches.         Past Medical History:  Diagnosis Date  . Arthritis   . Depression   . Disruption of rectum (HCC) 07/16/2015  . Diverticulosis    found via colonoscopy  . Internal hemorrhoids   . Rectal abscess   . Urinary tract infection     Social History   Socioeconomic History  . Marital status: Married    Spouse name: Not on file  . Number of children: 1  . Years of education: PhD  . Highest education level: Not on file  Occupational History  . 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 Smoking status: Former Smoker    Years: 20.00    Types: Cigarettes    Quit date: 12/08/1992    Years since quitting: 28.3  . Smokeless tobacco: Never Used  Substance and Sexual Activity  . Alcohol use: Yes    Comment: Occasional Wine  . Drug use: No  . Sexual activity: Not on file  Other Topics Concern  . Not on file  Social History Narrative   Married.   1 child.   Works at [employer], teaches Social Work.   Enjoys gardening, walking her dog.    Social Determinants of Health   Financial Resource Strain: Not on file  Food Insecurity: Not on file  Transportation Needs: Not on file  Physical Activity: Not on file  Stress: Not on file  Social Connections: Not on file  Intimate Partner Violence: Not on file    Past Surgical History:  Procedure Laterality Date  . ABDOMINAL HYSTERECTOMY  2002   Due to Fibroids  . EXCISION OF ADNEXAL MASS Right 04/2005  . OOPHORECTOMY Bilateral   . Rectal tear  2009   Rectal Abscess    Family History  Problem Relation Age of Onset  . Dementia Father   . Congestive Heart Failure Father   . Thyroid disease Sister   . Suicidality Brother   . Breast cancer Maternal Grandmother   . Breast cancer Mother 43       dx twice 81 and 41  . Arrhythmia  Sister   . Hypothyroidism Sister     Allergies  Allergen Reactions  . Sulfamethoxazole-Trimethoprim     Current Outpatient Medications on File Prior to Visit  Medication Sig Dispense Refill  . UNABLE TO FIND Take 1 capsule by mouth daily. Med Name: Natuerlo Whole Food Multivitamin for Women 50+    . UNABLE TO FIND Take 1 capsule by mouth daily. Med Name: Natuerlo B-Complex with CoQ10    . UNABLE TO FIND Take 1 capsule by mouth daily. Med Name: Natuerlo Vegan B12    . UNABLE TO FIND Take 1 capsule by mouth daily. Med Name: Naturelo Vitamin D3    . UNABLE TO FIND Take 1 capsule by mouth daily. Med Name: Natuerlo Turmeric & Ginger Root     No current facility-administered medications on file prior to visit.    BP 138/80   Pulse 78   Temp 98.3 F (36.8 C) (Temporal)   Ht 5\' 4"  (1.626 m)   Wt 160 lb (72.6 kg)   SpO2 99%   BMI 27.46 kg/m  Objective:   Physical Exam Cardiovascular:     Rate and Rhythm: Normal rate and regular rhythm.  Pulmonary:     Effort: Pulmonary effort is normal.     Breath sounds: Normal breath sounds.  Musculoskeletal:     Cervical back: Neck supple.     Comments: Stiffness to knees when rising from seated position.  Some difficulty with ROM when getting up to the exam table.   Skin:    General: Skin is warm and dry.  Neurological:     Mental Status: She is alert.           Assessment & Plan:      This visit occurred during the SARS-CoV-2 public health emergency.  Safety protocols were in place, including screening questions prior to the visit, additional usage of staff PPE, and extensive cleaning of exam room while observing appropriate contact time as indicated for disinfecting solutions.

## 2021-03-27 NOTE — Assessment & Plan Note (Signed)
Repeat echo in 2020 with mild mitral valve regurgitation. No murmur noted on exam today.

## 2021-03-27 NOTE — Patient Instructions (Signed)
Stop by the lab prior to leaving today. I will notify you of your results once received.   It was a pleasure to see you today!  

## 2021-03-27 NOTE — Assessment & Plan Note (Signed)
Repeat lipid panel pending. 

## 2021-03-27 NOTE — Assessment & Plan Note (Signed)
Chronic to nearly all joints, worse recently.   Checking labs today to evaluate for RA, gout, other autoimmune cause.   Recommended weight loss, continued stretching and activity. Consider rheumatology evaluation.

## 2021-03-27 NOTE — Assessment & Plan Note (Signed)
Acute and intermittent since yesterday.  ECG today with NSR with rate of 67, no PAC/PVC, acute ST changes. Appears somewhat similar to ECG from 2015.   Checking labs today including lipids, TSH, CBC, BMP. Consider cardiology evaluation.

## 2021-03-27 NOTE — Assessment & Plan Note (Signed)
Suspect symptoms to be secondary to arthritis, will check labs to differentiate RA vs osteoarthritis.  Consider rheumatology referral

## 2021-03-28 LAB — CBC
HCT: 41.2 % (ref 36.0–46.0)
Hemoglobin: 13.5 g/dL (ref 12.0–15.0)
MCHC: 32.8 g/dL (ref 30.0–36.0)
MCV: 88.7 fl (ref 78.0–100.0)
Platelets: 215 10*3/uL (ref 150.0–400.0)
RBC: 4.64 Mil/uL (ref 3.87–5.11)
RDW: 13.5 % (ref 11.5–15.5)
WBC: 6.2 10*3/uL (ref 4.0–10.5)

## 2021-03-28 LAB — LIPID PANEL
Cholesterol: 215 mg/dL — ABNORMAL HIGH (ref 0–200)
HDL: 65.6 mg/dL (ref >39.00)
LDL Cholesterol: 133 mg/dL — ABNORMAL HIGH (ref 0–99)
NonHDL: 149.62
Total CHOL/HDL Ratio: 3
Triglycerides: 82 mg/dL (ref 0.0–149.0)
VLDL: 16.4 mg/dL (ref 0.0–40.0)

## 2021-03-28 LAB — COMPREHENSIVE METABOLIC PANEL
ALT: 13 U/L (ref 0–35)
AST: 15 U/L (ref 0–37)
Albumin: 4.2 g/dL (ref 3.5–5.2)
Alkaline Phosphatase: 62 U/L (ref 39–117)
BUN: 12 mg/dL (ref 6–23)
CO2: 31 mEq/L (ref 19–32)
Calcium: 9.7 mg/dL (ref 8.4–10.5)
Chloride: 102 mEq/L (ref 96–112)
Creatinine, Ser: 0.71 mg/dL (ref 0.40–1.20)
GFR: 89.01 mL/min (ref >60.00)
Glucose, Bld: 87 mg/dL (ref 70–99)
Potassium: 4.2 mEq/L (ref 3.5–5.1)
Sodium: 140 mEq/L (ref 135–145)
Total Bilirubin: 0.7 mg/dL (ref 0.2–1.2)
Total Protein: 6.8 g/dL (ref 6.0–8.3)

## 2021-03-28 LAB — SEDIMENTATION RATE: Sed Rate: 9 mm/hr (ref 0–30)

## 2021-03-28 LAB — ANA: Anti Nuclear Antibody (ANA): NEGATIVE

## 2021-03-28 LAB — C-REACTIVE PROTEIN: CRP: <1 mg/dL (ref 0.5–20.0)

## 2021-03-28 LAB — URIC ACID: Uric Acid, Serum: 5.1 mg/dL (ref 2.4–7.0)

## 2021-03-28 LAB — CYCLIC CITRUL PEPTIDE ANTIBODY, IGG: Cyclic Citrullin Peptide Ab: <16 UNITS

## 2021-03-28 LAB — RHEUMATOID FACTOR: Rheumatoid fact SerPl-aCnc: <14 IU/mL (ref <14)

## 2021-03-28 LAB — TSH: TSH: 2.07 u[IU]/mL (ref 0.35–4.50)

## 2021-04-24 ENCOUNTER — Ambulatory Visit
Admission: RE | Admit: 2021-04-24 | Discharge: 2021-04-24 | Disposition: A | Discharge status: Home / Self Care | Payer: Medicare Other | Source: Ambulatory Visit | Attending: Primary Care | Admitting: Primary Care

## 2021-04-24 ENCOUNTER — Other Ambulatory Visit: Payer: Self-pay

## 2021-04-24 DIAGNOSIS — Z1231 Encounter for screening mammogram for malignant neoplasm of breast: Secondary | ICD-10-CM | POA: Insufficient documentation

## 2021-04-24 DIAGNOSIS — Z780 Asymptomatic menopausal state: Secondary | ICD-10-CM | POA: Diagnosis not present

## 2021-04-24 DIAGNOSIS — E2839 Other primary ovarian failure: Secondary | ICD-10-CM

## 2021-04-24 DIAGNOSIS — M8589 Other specified disorders of bone density and structure, multiple sites: Secondary | ICD-10-CM | POA: Diagnosis not present

## 2021-04-24 DIAGNOSIS — Z13820 Encounter for screening for osteoporosis: Secondary | ICD-10-CM | POA: Diagnosis not present

## 2021-05-03 ENCOUNTER — Other Ambulatory Visit: Payer: Self-pay

## 2021-05-03 ENCOUNTER — Ambulatory Visit (INDEPENDENT_AMBULATORY_CARE_PROVIDER_SITE_OTHER): Payer: Medicare Other | Admitting: Primary Care

## 2021-05-03 ENCOUNTER — Encounter: Payer: Self-pay | Admitting: Primary Care

## 2021-05-03 VITALS — BP 118/76 | HR 62 | Ht 64.0 in | Wt 159.4 lb

## 2021-05-03 DIAGNOSIS — Z0000 Encounter for general adult medical examination without abnormal findings: Secondary | ICD-10-CM | POA: Insufficient documentation

## 2021-05-03 DIAGNOSIS — Z23 Encounter for immunization: Secondary | ICD-10-CM | POA: Diagnosis not present

## 2021-05-03 DIAGNOSIS — M2550 Pain in unspecified joint: Secondary | ICD-10-CM | POA: Diagnosis not present

## 2021-05-03 DIAGNOSIS — M8580 Other specified disorders of bone density and structure, unspecified site: Secondary | ICD-10-CM | POA: Diagnosis not present

## 2021-05-03 NOTE — Patient Instructions (Signed)
It was a pleasure to see you today!   

## 2021-05-03 NOTE — Assessment & Plan Note (Signed)
Noted on recent bone density scan. Discussed use of vitamin D and calcium, weight bearing exercise.  Repeat in 2 years.

## 2021-05-03 NOTE — Assessment & Plan Note (Signed)
I have personally reviewed and have noted: 1. The patient's medical and social history 2. Their use of alcohol, tobacco or illicit drugs 3. Their current medications and supplements 4. The patient's functional ability including ADL's, fall risks, home safety risks and  hearing or visual impairment. 5. Diet and physical activities 6. Evidence for depression or mood disorder  Pneumonia vaccine due, Prevnar 20 provided today. Mammogram and bone density scan UTD, Cologuard UTd, due in 2023. Discussed the importance of a healthy diet and regular exercise in order for weight loss, and to reduce the risk of any potential medical problems. Exam today as noted. Labs reviewed.

## 2021-05-03 NOTE — Assessment & Plan Note (Signed)
Negative work up for [employer].  Referral placed to physical medicine.

## 2021-05-03 NOTE — Progress Notes (Signed)
HPI: Patricia Hicks is a 65 year old female who presents today for Welcome to medicare visit and follow up of health conditions.   She would also like to have a referral to physical medicine given her ongoing joint pain and negative work up. She prefers Educational psychologist and a female.   Past Medical History:  Diagnosis Date  . Arthritis   . Depression   . Disruption of rectum (HCC) 07/16/2015  . Diverticulosis    found via colonoscopy  . Internal hemorrhoids   . Rectal abscess   . Urinary tract infection     Current Outpatient Medications  Medication Sig Dispense Refill  . calcium carbonate (OSCAL) 1500 (600 Ca) MG TABS tablet Take 1,500 mg by mouth daily with breakfast.    . UNABLE TO FIND Take 1 capsule by mouth daily. Med Name: Natuerlo Whole Food Multivitamin for Women 50+    . UNABLE TO FIND Take 1 capsule by mouth daily. Med Name: Natuerlo B-Complex with CoQ10    . UNABLE TO FIND Take 1 capsule by mouth daily. Med Name: Natuerlo Vegan B12    . UNABLE TO FIND Take 1 capsule by mouth daily. Med Name: Naturelo Vitamin D3    . UNABLE TO FIND Take 1 capsule by mouth daily. Med Name: Natuerlo Turmeric & Ginger Root     No current facility-administered medications for this visit.    Allergies  Allergen Reactions  . Sulfamethoxazole-Trimethoprim     Family History  Problem Relation Age of Onset  . Dementia Father   . Congestive Heart Failure Father   . Thyroid disease Sister   . Suicidality Brother   . Breast cancer Maternal Grandmother   . Breast cancer Mother 30       dx twice 24 and 69  . Arrhythmia Sister   . Hypothyroidism Sister     Social History   Socioeconomic History  . Marital status: Married    Spouse name: Not on file  . Number of children: 1  . Years of education: PhD  . Highest education level: Not on file  Occupational History  . 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 Smoking  status: Former Smoker    Years: 20.00    Types: Cigarettes    Quit date: 12/08/1992    Years since quitting: 28.4  . Smokeless tobacco: Never Used  Substance and Sexual Activity  . Alcohol use: Yes    Comment: Occasional Wine  . Drug use: No  . Sexual activity: Not on file  Other Topics Concern  . Not on file  Social History Narrative   Married.   1 child.   Works at [employer], teaches Social Work.   Enjoys gardening, walking her dog.    Social Determinants of Health   Financial Resource Strain: Not on file  Food Insecurity: Not on file  Transportation Needs: Not on file  Physical Activity: Not on file  Stress: Not on file  Social Connections: Not on file  Intimate Partner Violence: Not on file    Hospitiliaztions: None  Health Maintenance:    Flu: Completed this season   Tetanus: 2013  Pneumovax: Never completed  Prevnar: Due today  Shingrix: Completed 2 vaccines  Bone Density: May 2022, osteopenia   Colonoscopy: 2010  Eye Doctor: Completes annually   Dental Exam: Completes semi-annually   Mammogram: May 2022  Hep C Screening: Negative    Providers:  Vernona Rieger, PCP   I have personally reviewed and have noted: 1. The patient's medical and social history 2. Their use of alcohol, tobacco or illicit drugs 3. Their current medications and supplements 4. The patient's functional ability including ADL's, fall risks, home safety risks and  hearing or visual impairment. 5. Diet and physical activities 6. Evidence for depression or mood disorder  Subjective:   Review of Systems:   Constitutional: Denies fever, malaise, fatigue, headache or abrupt weight changes.  HEENT: Denies eye pain, eye redness, ear pain, ringing in the ears, wax buildup, runny nose, nasal congestion, bloody nose, or sore throat. Respiratory: Denies difficulty breathing, shortness of breath, cough or sputum production.   Cardiovascular: Denies chest pain, chest tightness, palpitations or  swelling in the hands or feet.  Gastrointestinal: Denies abdominal pain, bloating, constipation, diarrhea or blood in the stool.  GU: Denies urgency, frequency, pain with urination, burning sensation, blood in urine, odor or discharge. Musculoskeletal: chronic joint pain  Skin: Denies redness, rashes, lesions or ulcercations.  Neurological: Denies dizziness, difficulty with memory, difficulty with speech or problems with balance and coordination.  Psychiatric: Denies concerns for anxiety or depression.   No other specific complaints in a complete review of systems (except as listed in HPI above).  Objective:  PE:   Ht 5\' 4"  (1.626 m)   Wt 159 lb 6.4 oz (72.3 kg)   BMI 27.36 kg/m  Wt Readings from Last 3 Encounters:  05/03/21 159 lb 6.4 oz (72.3 kg)  03/27/21 160 lb (72.6 kg)  11/12/20 157 lb (71.2 kg)    General: Appears their stated age, well developed, well nourished in NAD. Skin: Warm, dry and intact. No rashes, lesions or ulcerations noted. HEENT: Head: normal shape and size; Eyes: sclera white, no icterus, conjunctiva pink, PERRLA and EOMs intact; Ears: Tm's gray and intact, normal light reflex; Nose: mucosa pink and moist, septum midline; Throat/Mouth: Teeth present, mucosa pink and moist, no exudate, lesions or ulcerations noted.  Neck: Normal range of motion. Neck supple, trachea midline. No massses, lumps or thyromegaly present.  Cardiovascular: Normal rate and rhythm. S1,S2 noted.  No murmur, rubs or gallops noted. No JVD or BLE edema. No carotid bruits noted. Pulmonary/Chest: Normal effort and positive vesicular breath sounds. No respiratory distress. No wheezes, rales or ronchi noted.  Abdomen: Soft and nontender. Normal bowel sounds, no bruits noted. No distention or masses noted. Liver, spleen and kidneys non palpable. Musculoskeletal: Normal range of motion. No signs of joint swelling. No difficulty with gait.  Neurological: Alert and oriented. Cranial nerves II-XII  intact. Coordination normal. +DTRs bilaterally. Psychiatric: Mood and affect normal. Behavior is normal. Judgment and thought content normal.   EKG: Completed in April 2022, NSR with rate of 67, no PAC/PVC, ST changes. Compared to ECG from 2015.  BMET    Component Value Date/Time   NA 140 03/27/2021 1352   NA 139 03/25/2014 1505   K 4.2 03/27/2021 1352   K 3.4 (L) 03/25/2014 1505   CL 102 03/27/2021 1352   CL 107 03/25/2014 1505   CO2 31 03/27/2021 1352   CO2 22 03/25/2014 1505   GLUCOSE 87 03/27/2021 1352   GLUCOSE 98 03/25/2014 1505   BUN 12 03/27/2021 1352   BUN 19 (H) 03/25/2014 1505   CREATININE 0.71 03/27/2021 1352   CREATININE 0.57 (L) 03/25/2014 1505   CALCIUM 9.7 03/27/2021 1352   CALCIUM 9.5 03/25/2014 1505   GFRNONAA >60 07/27/2019 0025   GFRNONAA >60 03/25/2014 1505  GFRAA >60 07/27/2019 0025   GFRAA >60 03/25/2014 1505    Lipid Panel     Component Value Date/Time   CHOL 215 (H) 03/27/2021 1352   TRIG 82.0 03/27/2021 1352   HDL 65.60 03/27/2021 1352   CHOLHDL 3 03/27/2021 1352   VLDL 16.4 03/27/2021 1352   LDLCALC 133 (H) 03/27/2021 1352    CBC    Component Value Date/Time   WBC 6.2 03/27/2021 1352   RBC 4.64 03/27/2021 1352   HGB 13.5 03/27/2021 1352   HGB 13.8 03/25/2014 1505   HCT 41.2 03/27/2021 1352   HCT 42.4 03/25/2014 1505   PLT 215.0 03/27/2021 1352   PLT 228 03/25/2014 1505   MCV 88.7 03/27/2021 1352   MCV 88 03/25/2014 1505   MCH 29.0 07/27/2019 0025   MCHC 32.8 03/27/2021 1352   RDW 13.5 03/27/2021 1352   RDW 13.2 03/25/2014 1505    Hgb A1C Lab Results  Component Value Date   HGBA1C 5.2 05/15/2016    BP Readings from Last 3 Encounters:  05/03/21 118/76  03/27/21 138/80  11/12/20 134/82     Assessment and Plan:   Medicare Annual Wellness Visit:  Diet: She endorses a healthy diet Physical activity: Active Depression/mood screen: Negative Hearing: Intact to whispered voice Visual acuity: Grossly normal, performs  annual eye exam  ADLs: Capable Fall risk: None Home safety: Good Cognitive evaluation: Intact to orientation, naming, recall and repetition EOL planning: Adv directives, full code/ I agree  Preventative Medicine: Pneumonia vaccine due, Prevnar 20 provided today. Mammogram and bone density scan UTD, Cologuard UTd, due in 2023. Discussed the importance of a healthy diet and regular exercise in order for weight loss, and to reduce the risk of any potential medical problems. Exam today as noted. Labs reviewed.   Next appointment: 1 year

## 2021-05-07 DIAGNOSIS — M2550 Pain in unspecified joint: Secondary | ICD-10-CM

## 2021-05-15 DIAGNOSIS — Z20822 Contact with and (suspected) exposure to covid-19: Secondary | ICD-10-CM | POA: Diagnosis not present

## 2021-06-03 ENCOUNTER — Other Ambulatory Visit: Payer: Self-pay

## 2021-06-03 ENCOUNTER — Ambulatory Visit
Admission: RE | Admit: 2021-06-03 | Discharge: 2021-06-03 | Disposition: A | Discharge status: Home / Self Care | Payer: Medicare Other | Source: Ambulatory Visit | Attending: Emergency Medicine | Admitting: Emergency Medicine

## 2021-06-03 VITALS — BP 136/91 | HR 71 | Temp 99.1°F | Resp 18

## 2021-06-03 DIAGNOSIS — U071 COVID-19: Secondary | ICD-10-CM | POA: Diagnosis not present

## 2021-06-03 MED ORDER — PAXLOVID 20 X 150 MG & 10 X 100MG PO TBPK: 1.0000 | ORAL_TABLET | Freq: Two times a day (BID) | ORAL | 0 refills | Status: AC

## 2021-06-03 NOTE — Telephone Encounter (Signed)
Forwarded to [facility] so she can see the message I sent to the pt. I also called the pt asking her to call so I could give her the same information.

## 2021-06-03 NOTE — ED Provider Notes (Signed)
Patricia Hicks    CSN: [id number] Arrival date & time: 06/03/21  1142      History   Chief Complaint Chief Complaint  Patient presents with   Cough   Headache   Sore Throat    HPI Patricia Hicks is a 65 y.o. female.  Patient presents with 5-day history of congestion, sore throat, nonproductive cough.  She also reports low-grade fever.  She denies rash, shortness of breath, vomiting, diarrhea, or other symptoms.  She recently traveled on a cruise out of the country.  She tested positive for COVID at home yesterday.  Her medical history includes mitral incompetence, diverticulosis, arthritis, B12 deficiency.  The history is provided by the patient and medical records.   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   B12 deficiency 07/16/2015   Chondromalacia 07/16/2015   Hyperlipidemia 07/16/2015   MI (mitral incompetence) 07/16/2015   Avitaminosis D 07/16/2015    Past Surgical History:  Procedure Laterality Date   ABDOMINAL HYSTERECTOMY  2002   Due to Fibroids   EXCISION OF ADNEXAL MASS Right 04/2005   OOPHORECTOMY Bilateral    Rectal tear  2009   Rectal Abscess    OB History     Gravida  1   Para  1   Term      Preterm      AB      Living         SAB      IAB      Ectopic      Multiple      Live Births               Home Medications    Prior to Admission medications   Medication Sig Start Date End Date Taking? Authorizing Provider   Nirmatrelvir & Ritonavir (PAXLOVID) 20 x 150 MG & 10 x 100MG  TBPK Take 1 tablet by mouth in the morning and at bedtime for 5 days. 06/03/21 06/08/21 Yes 08/09/21, NP  calcium carbonate (OSCAL) 1500 (600 Ca) MG TABS tablet Take 1,500 mg by mouth daily with breakfast.    [provider]  UNABLE TO FIND Take 1 capsule by mouth daily. Med Name: Natuerlo Whole Food Multivitamin for Women 50+    [provider]  UNABLE TO FIND Take 1 capsule by mouth daily. Med Name: Natuerlo B-Complex with CoQ10    [provider]  UNABLE TO FIND Take 1 capsule by mouth daily. Med Name: Natuerlo Vegan B12    [provider]  UNABLE TO FIND Take 1 capsule by mouth daily. Med Name: Naturelo Vitamin D3    [provider]  UNABLE TO FIND Take 1 capsule by mouth daily. Med Name: Natuerlo Turmeric & Ginger Root    [provider]    Family History Family History  Problem Relation Age of Onset   Dementia Father    Congestive Heart Failure Father  Thyroid disease Sister    Suicidality Brother    Breast cancer Maternal Grandmother    Breast cancer Mother 12       dx twice 36 and 54   Arrhythmia Sister    Hypothyroidism Sister     Social History Social History   Tobacco Use   Smoking status: Former    Years: 20.00    Pack years: 0.00    Types: Cigarettes    Quit date: 12/08/1992    Years since quitting: 28.5   Smokeless tobacco: Never  Substance Use Topics   Alcohol use: Yes    Comment: Occasional Wine   Drug use: No     Allergies   Sulfamethoxazole-trimethoprim   Review of Systems Review of Systems  Constitutional:  Positive for fever. Negative for chills.  HENT:  Positive for congestion and sore throat. Negative for ear pain.   Respiratory:  Positive for cough. Negative for shortness of breath.   Cardiovascular:  Negative for chest pain and palpitations.  Gastrointestinal:  Negative for abdominal pain, diarrhea and vomiting.  Skin:   Negative for color change and rash.  All other systems reviewed and are negative.   Physical Exam Triage Vital Signs ED Triage Vitals  Enc Vitals Group     BP      Pulse      Resp      Temp      Temp src      SpO2      Weight      Height      Head Circumference      Peak Flow      Pain Score      Pain Loc      Pain Edu?      Excl. in GC?    No data found.  Updated Vital Signs BP (!) 136/91 (BP Location: Left Arm)   Pulse 71   Temp 99.1 F (37.3 C) (Oral)   Resp 18   SpO2 96%   Visual Acuity Right Eye Distance:   Left Eye Distance:   Bilateral Distance:    Right Eye Near:   Left Eye Near:    Bilateral Near:     Physical Exam Vitals and nursing note reviewed.  Constitutional:      General: She is not in acute distress.    Appearance: She is well-developed.  HENT:     Head: Normocephalic and atraumatic.  Eyes:     Conjunctiva/sclera: Conjunctivae normal.  Cardiovascular:     Rate and Rhythm: Normal rate and regular rhythm.     Heart sounds: No murmur heard. Pulmonary:     Effort: Pulmonary effort is normal. No respiratory distress.     Breath sounds: Normal breath sounds.  Abdominal:     Palpations: Abdomen is soft.     Tenderness: There is no abdominal tenderness.  Musculoskeletal:     Cervical back: Neck supple.  Skin:    General: Skin is warm and dry.  Neurological:     Mental Status: She is alert.     UC Treatments / Results  Labs (all labs ordered are listed, but only abnormal results are displayed) Labs Reviewed - No data to display  EKG   Radiology No results found.  Procedures Procedures (including critical care time)  Medications Ordered in UC Medications - No data to display  Initial Impression / Assessment and Plan / UC Course  I have reviewed the triage vital signs and the nursing notes.  Pertinent labs & imaging results that were available during my care of the patient were reviewed by me and considered in my medical  decision making (see chart for details).  COVID-19.  Patient is on day 5 of her symptoms.  She was instructed by her PCP to come here for prescription for COVID antiviral.  GFR 89 on 03/27/2021.  Treating with Paxlovid; Discussed side effects and potential for rebound COVID.  Patient is not on any prescription medications at home but she does take several OTC vitamin supplements.  I instructed her to pause these vitamin supplements until she finishes the Paxlovid.  Discussed symptomatic treatment with Tylenol or ibuprofen as needed.  Discussed CDC guidelines for quarantine.  Instructed her to follow-up with her PCP as needed.  She agrees to plan of care.   Final Clinical Impressions(s) / UC Diagnoses   Final diagnoses:  COVID-19     Discharge Instructions      The prescription for Paxlovid was sent to your pharmacy.    Continue to quarantine per CDC guidelines.  Take Tylenol or ibuprofen as needed for fever or discomfort.  Follow up with your primary care provider if your symptoms are not improving.         ED Prescriptions     Medication Sig Dispense Auth. Provider   Nirmatrelvir & Ritonavir (PAXLOVID) 20 x 150 MG & 10 x 100MG  TBPK Take 1 tablet by mouth in the morning and at bedtime for 5 days. 10 tablet , NP      PDMP not reviewed this encounter.   Mickie Bail, NP 06/03/21 1255

## 2021-06-03 NOTE — ED Triage Notes (Signed)
Pt presents today with c/o of nasal congestion, sore throat and cough x 5. She tested Covid positive on home test yesterday. She is inquiring on antiviral treatment.

## 2021-06-03 NOTE — Discharge Instructions (Addendum)
The prescription for Paxlovid was sent to your pharmacy.    Continue to quarantine per CDC guidelines.  Take Tylenol or ibuprofen as needed for fever or discomfort.  Follow up with your primary care provider if your symptoms are not improving.

## 2021-06-11 DIAGNOSIS — G8929 Other chronic pain: Secondary | ICD-10-CM | POA: Diagnosis not present

## 2021-06-11 DIAGNOSIS — M79641 Pain in right hand: Secondary | ICD-10-CM | POA: Diagnosis not present

## 2021-06-11 DIAGNOSIS — M25561 Pain in right knee: Secondary | ICD-10-CM | POA: Diagnosis not present

## 2021-06-11 DIAGNOSIS — M79642 Pain in left hand: Secondary | ICD-10-CM | POA: Diagnosis not present

## 2021-06-11 DIAGNOSIS — M25562 Pain in left knee: Secondary | ICD-10-CM | POA: Diagnosis not present

## 2021-06-11 DIAGNOSIS — M545 Low back pain, unspecified: Secondary | ICD-10-CM | POA: Diagnosis not present

## 2021-06-11 DIAGNOSIS — M542 Cervicalgia: Secondary | ICD-10-CM | POA: Diagnosis not present

## 2021-06-11 DIAGNOSIS — M25551 Pain in right hip: Secondary | ICD-10-CM | POA: Diagnosis not present

## 2021-07-16 DIAGNOSIS — M3500 Sicca syndrome, unspecified: Secondary | ICD-10-CM | POA: Diagnosis not present

## 2021-07-16 DIAGNOSIS — M159 Polyosteoarthritis, unspecified: Secondary | ICD-10-CM | POA: Diagnosis not present

## 2021-07-16 DIAGNOSIS — M2550 Pain in unspecified joint: Secondary | ICD-10-CM | POA: Diagnosis not present

## 2021-07-16 DIAGNOSIS — R5382 Chronic fatigue, unspecified: Secondary | ICD-10-CM | POA: Diagnosis not present

## 2021-08-22 ENCOUNTER — Encounter: Payer: Self-pay | Admitting: Primary Care

## 2021-08-22 ENCOUNTER — Ambulatory Visit (INDEPENDENT_AMBULATORY_CARE_PROVIDER_SITE_OTHER): Payer: Medicare Other | Admitting: Primary Care

## 2021-08-22 ENCOUNTER — Other Ambulatory Visit: Payer: Self-pay

## 2021-08-22 VITALS — BP 120/82 | HR 74 | Temp 97.6°F | Ht 64.0 in | Wt 157.0 lb

## 2021-08-22 DIAGNOSIS — H938X2 Other specified disorders of left ear: Secondary | ICD-10-CM

## 2021-08-22 HISTORY — DX: Other specified disorders of left ear: H93.8X2

## 2021-08-22 MED ORDER — PREDNISONE 20 MG PO TABS: ORAL_TABLET | ORAL | 0 refills | Status: DC

## 2021-08-22 NOTE — Progress Notes (Signed)
Subjective:    Patient ID: Patricia Hicks, female    DOB: [birth year], 65 y.o.   MRN: [id number]  HPI  Patricia Hicks is a very pleasant 65 y.o. female who presents today to discuss ear fullness.   The fullness is located to the left ear which began about 10-14 days ago. She's noticed a humming and echo sound constantly to the left ear. Initially she couldn't hear out of her left ear at all, this lasted for a few days. Also experienced a few hours of vertigo symptoms during the first few days of symptoms, felt like the room was spinning, worse when laying down. This abated.   She's used Flonase without improvement. She denies cough, post nasal drip, fevers. She had Covid-19 infection in June 2022, this caused bilateral ear fullness which resolved completely thereafter. She had a hearing test about one year ago, was told her hearing was fine.   She denies a gradual loss of hearing, headaches, frequent dizziness, blurred vision, imbalance.   BP Readings from Last 3 Encounters:  08/22/21 120/82  06/03/21 (!) 136/91  05/03/21 118/76     Review of Systems  HENT:  Positive for postnasal drip. Negative for congestion and ear pain.        Ear fullness   Eyes:  Negative for visual disturbance.  Respiratory:  Negative for cough.   Neurological:  Negative for dizziness.        Past Medical History:  Diagnosis Date   Arthritis    Depression    Disruption of rectum (HCC) 07/16/2015   Diverticulosis    found via colonoscopy   Internal hemorrhoids    Rectal abscess    Urinary tract infection     Social History   Socioeconomic History   Marital status: Married    Spouse name: Not on file   Number of children: 1   Years of education: PhD   Highest education level: Not on file  Occupational History   Occupation: Professor    Employer: [employer]    Comment: Department chair for [employer]  Tobacco Use   Smoking status: Former    Years: 20.00    Types: Cigarettes    Quit  date: 12/08/1992    Years since quitting: 28.7   Smokeless tobacco: Never  Substance and Sexual Activity   Alcohol use: Yes    Comment: Occasional Wine   Drug use: No   Sexual activity: Not on file  Other Topics Concern   Not on file  Social History Narrative   Married.   1 child.   Works at [employer], teaches Social Work.   Enjoys gardening, walking her dog.    Social Determinants of Health   Financial Resource Strain: Not on file  Food Insecurity: Not on file  Transportation Needs: Not on file  Physical Activity: Not on file  Stress: Not on file  Social Connections: Not on file  Intimate Partner Violence: Not on file    Past Surgical History:  Procedure Laterality Date   ABDOMINAL HYSTERECTOMY  2002   Due to Fibroids   EXCISION OF ADNEXAL MASS Right 04/2005   OOPHORECTOMY Bilateral    Rectal tear  2009   Rectal Abscess    Family History  Problem Relation Age of Onset   Dementia Father    Congestive Heart Failure Father    Thyroid disease Sister    Suicidality Brother    Breast cancer Maternal Grandmother    Breast cancer Mother 10  dx twice 77 and 80   Arrhythmia Sister    Hypothyroidism Sister     Allergies  Allergen Reactions   Sulfamethoxazole-Trimethoprim     Current Outpatient Medications on File Prior to Visit  Medication Sig Dispense Refill   calcium carbonate (OSCAL) 1500 (600 Ca) MG TABS tablet Take 1,500 mg by mouth daily with breakfast.     UNABLE TO FIND Take 1 capsule by mouth daily. Med Name: Natuerlo Whole Food Multivitamin for Women 50+     UNABLE TO FIND Take 1 capsule by mouth daily. Med Name: Natuerlo B-Complex with CoQ10     UNABLE TO FIND Take 1 capsule by mouth daily. Med Name: Natuerlo Vegan B12     UNABLE TO FIND Take 1 capsule by mouth daily. Med Name: Naturelo Vitamin D3     UNABLE TO FIND Take 1 capsule by mouth daily. Med Name: Natuerlo Turmeric & Ginger Root     No current facility-administered medications on file prior to  visit.    BP 120/82   Pulse 74   Temp 97.6 F (36.4 C) (Temporal)   Ht 5\' 4"  (1.626 m)   Wt 157 lb (71.2 kg)   SpO2 98%   BMI 26.95 kg/m  Objective:   Physical Exam HENT:     Right Ear: No tenderness. There is no impacted cerumen. Tympanic membrane is bulging. Tympanic membrane is not erythematous.     Left Ear: No tenderness. There is no impacted cerumen. Tympanic membrane is bulging. Tympanic membrane is not erythematous.     Ears:     Comments: Fluid noted behind right TM. Left TM dull.  Cardiovascular:     Rate and Rhythm: Normal rate.  Pulmonary:     Effort: Pulmonary effort is normal.  Musculoskeletal:     Cervical back: Neck supple.  Skin:    General: Skin is warm and dry.          Assessment & Plan:      This visit occurred during the SARS-CoV-2 public health emergency.  Safety protocols were in place, including screening questions prior to the visit, additional usage of staff PPE, and extensive cleaning of exam room while observing appropriate contact time as indicated for disinfecting solutions.

## 2021-08-22 NOTE — Assessment & Plan Note (Signed)
Acute for 2 weeks, also with a few hours of vertigo.  Exam today with obvious bulging without infection, mild fluid build up.  Suspect non infectious effusion. No improvement with Flonase so will trial prednisone.   Rx for prednisone 20 mg sent to pharmacy. She will update.   Consider antihistamine, consider ENT referral.

## 2021-08-22 NOTE — Patient Instructions (Addendum)
Start prednisone 20 mg tablets. Take 2 tablets by mouth once daily for 5 days.  You can try adding an antihistamine such as Zyrtec, Allegra, Claritin.   It was a pleasure to see you today!      Influenza (Flu) Vaccine (Inactivated or Recombinant): What You Need to Know 1. Why get vaccinated? Influenza vaccine can prevent influenza (flu). Flu is a contagious disease that spreads around the [place] every year, usually between October and May. Anyone can get the flu, but it is more dangerous for some people. Infants and young children, people 28 years and older, pregnant people, and people with certain health conditions or a weakened immune system are at greatest risk of flu complications. Pneumonia, bronchitis, sinus infections, and ear infections are examples of flu-related complications. If you have a medical condition, such as heart disease, cancer, or diabetes, flu can make it worse. Flu can cause fever and chills, sore throat, muscle aches, fatigue, cough, headache, and runny or stuffy nose. Some people may have vomiting and diarrhea, though this is more common in children than adults. In an average year, thousands of people in the [place] die from flu, and many more are hospitalized. Flu vaccine prevents millions of illnesses and flu-related visits to the doctor each year. 2. Influenza vaccines CDC recommends everyone 6 months and older get vaccinated every flu season. Children 6 months through 30 years of age may need 2 doses during a single flu season. Everyone else needs only 1 dose each flu season. It takes about 2 weeks for protection to develop after vaccination. There are many flu viruses, and they are always changing. Each year a new flu vaccine is made to protect against the influenza viruses believed to be likely to cause disease in the upcoming flu season. Even when the vaccine doesn't exactly match these viruses, it may still provide some protection. Influenza vaccine  does not cause flu. Influenza vaccine may be given at the same time as other vaccines. 3. Talk with your health care provider Tell your vaccination provider if the person getting the vaccine: Has had an allergic reaction after a previous dose of influenza vaccine, or has any severe, life-threatening allergies Has ever had Guillain-Barr Syndrome (also called "GBS") In some cases, your health care provider may decide to postpone influenza vaccination until a future visit. Influenza vaccine can be administered at any time during pregnancy. People who are or will be pregnant during influenza season should receive inactivated influenza vaccine. People with minor illnesses, such as a cold, may be vaccinated. People who are moderately or severely ill should usually wait until they recover before getting influenza vaccine. Your health care provider can give you more information. 4. Risks of a vaccine reaction Soreness, redness, and swelling where the shot is given, fever, muscle aches, and headache can happen after influenza vaccination. There may be a very small increased risk of Guillain-Barr Syndrome (GBS) after inactivated influenza vaccine (the flu shot). Young children who get the flu shot along with pneumococcal vaccine (PCV13) and/or DTaP vaccine at the same time might be slightly more likely to have a seizure caused by fever. Tell your health care provider if a child who is getting flu vaccine has ever had a seizure. People sometimes faint after medical procedures, including vaccination. Tell your provider if you feel dizzy or have vision changes or ringing in the ears. As with any medicine, there is a very remote chance of a vaccine causing a severe allergic reaction, other  serious injury, or death. 5. What if there is a serious problem? An allergic reaction could occur after the vaccinated person leaves the clinic. If you see signs of a severe allergic reaction (hives, swelling of the face and  throat, difficulty breathing, a fast heartbeat, dizziness, or weakness), call 9-1-1 and get the person to the nearest hospital. For other signs that concern you, call your health care provider. Adverse reactions should be reported to the Vaccine Adverse Event Reporting System (VAERS). Your health care provider will usually file this report, or you can do it yourself. Visit the VAERS website at www.vaers.LAgents.no or call 859-325-9533. VAERS is only for reporting reactions, and VAERS staff members do not give medical advice. 6. The National Vaccine Injury Compensation Program The [employer] Vaccine Injury Compensation Program (VICP) is a federal program that was created to compensate people who may have been injured by certain vaccines. Claims regarding alleged injury or death due to vaccination have a time limit for filing, which may be as short as two years. Visit the VICP website at SpiritualWord.at or call 903-469-8392 to learn about the program and about filing a claim. 7. How can I learn more? Ask your health care provider. Call your local or state health department. Visit the website of the Food and Drug Administration (FDA) for vaccine package inserts and additional information at FinderList.no. Contact the Centers for Disease Control and Prevention (CDC): Call 567-254-3398 (1-800-CDC-INFO) or Visit CDC's website at BiotechRoom.com.cy. Vaccine Information Statement Inactivated Influenza Vaccine (07/13/2020) This information is not intended to replace advice given to you by your health care provider. Make sure you discuss any questions you have with your health care provider. Document Revised: 08/30/2020 Document Reviewed: 08/30/2020 Elsevier Patient Education  2022 [employer].

## 2021-09-19 DIAGNOSIS — H938X2 Other specified disorders of left ear: Secondary | ICD-10-CM

## 2021-10-18 DIAGNOSIS — H903 Sensorineural hearing loss, bilateral: Secondary | ICD-10-CM | POA: Diagnosis not present

## 2021-10-18 DIAGNOSIS — H6982 Other specified disorders of Eustachian tube, left ear: Secondary | ICD-10-CM | POA: Diagnosis not present

## 2021-11-06 NOTE — Telephone Encounter (Signed)
Called and scheduled pt

## 2021-11-08 ENCOUNTER — Encounter: Payer: Self-pay | Admitting: Primary Care

## 2021-11-08 ENCOUNTER — Ambulatory Visit (INDEPENDENT_AMBULATORY_CARE_PROVIDER_SITE_OTHER): Payer: Medicare Other | Admitting: Primary Care

## 2021-11-08 ENCOUNTER — Other Ambulatory Visit: Payer: Self-pay

## 2021-11-08 VITALS — BP 136/78 | HR 72 | Temp 98.2°F | Wt 154.2 lb

## 2021-11-08 DIAGNOSIS — L0291 Cutaneous abscess, unspecified: Secondary | ICD-10-CM | POA: Diagnosis not present

## 2021-11-08 MED ORDER — DOXYCYCLINE HYCLATE 100 MG PO TABS: 100.0000 mg | ORAL_TABLET | Freq: Two times a day (BID) | ORAL | 0 refills | Status: DC

## 2021-11-08 NOTE — Patient Instructions (Signed)
Start Doxycycline antibiotic for the infection. Take 1 tablet by mouth twice daily for 7 days.  Keep the wound clean, dry, and covered.   It was a pleasure to see you today!

## 2021-11-08 NOTE — Progress Notes (Signed)
Subjective:    Patient ID: Patricia Hicks, female    DOB: 04-Oct-1956, 65 y.o.   MRN: [id number]  HPI  Patricia Hicks is a very pleasant 65 y.o. female with a history of arthritis, vitamin B12 deficiency, abscess who presents today to discuss abscess  The abscess is located to the right posterior shoulder/right upper thoracic back for which she first noticed 3-4 days ago. Since then she developed redness, warmth, with enlarging mass and pain.   Last night she was removing her bandage and saw whitish puss with blood as the mass has popped.   She has a history of cysts to her skin which wax and wane in size and eventually burst, typically heals on its own. She once saw a dermatologist but wasn't provided clear insight.  She was evaluated one year ago for the same condition as today, provided Doxycycline 100 mg for 7 day course which provided complete healing. She had no complications.    Review of Systems  Constitutional:  Negative for fever.  Skin:  Positive for color change and wound.        Past Medical History:  Diagnosis Date   Arthritis    Depression    Disruption of rectum ([facility]CC) 07/16/2015   Diverticulosis    found via colonoscopy   Internal hemorrhoids    Rectal abscess    Urinary tract infection     Social History   Socioeconomic History   Marital status: Married    Spouse name: Not on file   Number of children: 1   Years of education: PhD   Highest education level: Not on file  Occupational History   Occupation: Professor    Employer: [employer]    Comment: Department chair for [employer]  Tobacco Use   Smoking status: Former    Years: 20.00    Types: Cigarettes    Quit date: 12/08/1992    Years since quitting: 28.9   Smokeless tobacco: Never  Substance and Sexual Activity   Alcohol use: Yes    Comment: Occasional Wine   Drug use: No   Sexual activity: Not on file  Other Topics Concern   Not on file  Social History Narrative   Married.   1  child.   Works at [employer], teaches Social Work.   Enjoys gardening, walking her dog.    Social Determinants of Health   Financial Resource Strain: Not on file  Food Insecurity: Not on file  Transportation Needs: Not on file  Physical Activity: Not on file  Stress: Not on file  Social Connections: Not on file  Intimate Partner Violence: Not on file    Past Surgical History:  Procedure Laterality Date   ABDOMINAL HYSTERECTOMY  2002   Due to Fibroids   EXCISION OF ADNEXAL MASS Right 04/2005   OOPHORECTOMY Bilateral    Rectal tear  2009   Rectal Abscess    Family History  Problem Relation Age of Onset   Dementia Father    Congestive Heart Failure Father    Thyroid disease Sister    Suicidality Brother    Breast cancer Maternal Grandmother    Breast cancer Mother 59       dx twice 49 and 80   Arrhythmia Sister    Hypothyroidism Sister     Allergies  Allergen Reactions   Sulfamethoxazole-Trimethoprim     Current Outpatient Medications on File Prior to Visit  Medication Sig Dispense Refill   calcium carbonate (OSCAL) 1500 (600 Ca)  MG TABS tablet Take 1,500 mg by mouth daily with breakfast.     UNABLE TO FIND Take 1 capsule by mouth daily. Med Name: Natuerlo Whole Food Multivitamin for Women 50+     UNABLE TO FIND Take 1 capsule by mouth daily. Med Name: Natuerlo B-Complex with CoQ10     UNABLE TO FIND Take 1 capsule by mouth daily. Med Name: Natuerlo Vegan B12     UNABLE TO FIND Take 1 capsule by mouth daily. Med Name: Naturelo Vitamin D3     UNABLE TO FIND Take 1 capsule by mouth daily. Med Name: Natuerlo Turmeric & Ginger Root     No current facility-administered medications on file prior to visit.    BP 136/78 (BP Location: Left Arm, Patient Position: Sitting, Cuff Size: Normal)   Pulse 72   Temp 98.2 F (36.8 C) (Temporal)   Wt 154 lb 3 oz (69.9 kg)   SpO2 100%   BMI 26.47 kg/m  Objective:   Physical Exam Cardiovascular:     Rate and Rhythm: Normal rate.   Pulmonary:     Effort: Pulmonary effort is normal.  Musculoskeletal:     Cervical back: Neck supple.  Skin:    General: Skin is warm.     Comments: 2 cm subcutaneous mass with surrounding erythema, opening in center with whitish clear fluid. Tender.           Assessment & Plan:      This visit occurred during the SARS-CoV-2 public health emergency.  Safety protocols were in place, including screening questions prior to the visit, additional usage of staff PPE, and extensive cleaning of exam room while observing appropriate contact time as indicated for disinfecting solutions.

## 2021-11-08 NOTE — Assessment & Plan Note (Signed)
Appears to be infected cyst.   Will treat with course of Doxycycline 100 mg BID x 7 days as this was effective last year.  Discussed long term treatment with dermatology, she will think about this.   Continue dressing changes, discussed home care.

## 2022-04-21 ENCOUNTER — Other Ambulatory Visit: Payer: Self-pay | Admitting: Primary Care

## 2022-04-21 DIAGNOSIS — Z1231 Encounter for screening mammogram for malignant neoplasm of breast: Secondary | ICD-10-CM

## 2022-05-06 ENCOUNTER — Ambulatory Visit (INDEPENDENT_AMBULATORY_CARE_PROVIDER_SITE_OTHER): Payer: Medicare Other | Admitting: Primary Care

## 2022-05-06 ENCOUNTER — Encounter: Payer: Self-pay | Admitting: Primary Care

## 2022-05-06 VITALS — BP 132/88 | HR 69 | Temp 98.6°F | Ht 64.0 in | Wt 158.0 lb

## 2022-05-06 DIAGNOSIS — E785 Hyperlipidemia, unspecified: Secondary | ICD-10-CM | POA: Diagnosis not present

## 2022-05-06 DIAGNOSIS — M2550 Pain in unspecified joint: Secondary | ICD-10-CM

## 2022-05-06 DIAGNOSIS — M1990 Unspecified osteoarthritis, unspecified site: Secondary | ICD-10-CM

## 2022-05-06 DIAGNOSIS — M8580 Other specified disorders of bone density and structure, unspecified site: Secondary | ICD-10-CM

## 2022-05-06 DIAGNOSIS — R229 Localized swelling, mass and lump, unspecified: Secondary | ICD-10-CM

## 2022-05-06 DIAGNOSIS — Z1211 Encounter for screening for malignant neoplasm of colon: Secondary | ICD-10-CM

## 2022-05-06 LAB — COMPREHENSIVE METABOLIC PANEL
ALT: 11 U/L (ref 0–35)
AST: 14 U/L (ref 0–37)
Albumin: 4.2 g/dL (ref 3.5–5.2)
Alkaline Phosphatase: 55 U/L (ref 39–117)
BUN: 10 mg/dL (ref 6–23)
CO2: 28 mEq/L (ref 19–32)
Calcium: 9.6 mg/dL (ref 8.4–10.5)
Chloride: 103 mEq/L (ref 96–112)
Creatinine, Ser: 0.74 mg/dL (ref 0.40–1.20)
GFR: 84.04 mL/min (ref >60.00)
Glucose, Bld: 110 mg/dL — ABNORMAL HIGH (ref 70–99)
Potassium: 4.1 mEq/L (ref 3.5–5.1)
Sodium: 140 mEq/L (ref 135–145)
Total Bilirubin: 0.7 mg/dL (ref 0.2–1.2)
Total Protein: 6.6 g/dL (ref 6.0–8.3)

## 2022-05-06 LAB — LIPID PANEL
Cholesterol: 190 mg/dL (ref 0–200)
HDL: 64.3 mg/dL (ref >39.00)
LDL Cholesterol: 106 mg/dL — ABNORMAL HIGH (ref 0–99)
NonHDL: 125.81
Total CHOL/HDL Ratio: 3
Triglycerides: 97 mg/dL (ref 0.0–149.0)
VLDL: 19.4 mg/dL (ref 0.0–40.0)

## 2022-05-06 LAB — CBC
HCT: 40.8 % (ref 36.0–46.0)
Hemoglobin: 13.4 g/dL (ref 12.0–15.0)
MCHC: 32.9 g/dL (ref 30.0–36.0)
MCV: 89.3 fl (ref 78.0–100.0)
Platelets: 194 10*3/uL (ref 150.0–400.0)
RBC: 4.57 Mil/uL (ref 3.87–5.11)
RDW: 13.7 % (ref 11.5–15.5)
WBC: 4.6 10*3/uL (ref 4.0–10.5)

## 2022-05-06 NOTE — Assessment & Plan Note (Signed)
Continued, stable.  Continue regular movement and activity. Continue OTC supplements.   Continue to monitor.

## 2022-05-06 NOTE — Patient Instructions (Signed)
Stop by the lab prior to leaving today. I will notify you of your results once received.   You are due for pneumovax 23 pneumonia vaccine.  Complete the Cologuard kit once received.  It was a pleasure to see you today!

## 2022-05-06 NOTE — Assessment & Plan Note (Signed)
Discussed the importance of a healthy diet and regular exercise in order for weight loss, and to reduce the risk of further co-morbidity.  Commended her on a healthy diet and activity. Repeat lipid panel pending.

## 2022-05-06 NOTE — Assessment & Plan Note (Signed)
Ongoing, overall stable. Evaluated by rheumatology x 2.  Continue regular activity.  Continue OTC supplements.

## 2022-05-06 NOTE — Progress Notes (Addendum)
Subjective:    Patient ID: Patricia Hicks, female    DOB: 1956-05-15, 66 y.o.   MRN: [id number]  HPI  Patricia Hicks is a very pleasant 66 y.o. female with a history of arthritis, B12 deficiency, hyperlipidemia, osteopenia who presents today for follow up of chronic conditions.  1) Osteopenia: Diagnosed in 2022 per bone density scan. She is compliant to calcium and vitamin D daily. She is active at home, weight bearing exercise.   2) Hyperlipidemia: Not currently managed on treatment. LDL of 133 in April 2022 with HDL of 65. She is a vegetarian. She endorses a healthy diet, is very contentious.   3) Osteoarthritis: Chronic to hands, knees, lower back. Evaluated for RA and work up was negative. Evaluated by rheumatology in 2022, no further recommendations. She continues to notice pain to most of her joints, is working to stay active.   4) Skin Mass: Chronic to the right lateral shoulder/lateral neck for >1 year. She's noticed a slight decrease in size since taking B3 vitamins OTC. She has a history of skin cysts, this feels similar but does have a different appearance.   The 10-year ASCVD risk score (Arnett DK, et al., 2019) is: 6.4%   Values used to calculate the score:     Age: 24 years     Sex: Female     Is Non-Hispanic African American: No     Diabetic: No     Tobacco smoker: No     Systolic Blood Pressure: Q000111Q mmHg     Is BP treated: No     HDL Cholesterol: 65.6 mg/dL     Total Cholesterol: 215 mg/dL   Immunizations: -Tetanus: 2013 -Influenza: Completed last season  -Covid-19: 5 vaccines  -Shingles: Completed Shingrix series  -Pneumonia: Prevnar 20 in May 2022,   Mammogram: Completed in May 2022, scheduled for June 2023  Colonoscopy: Completed Cologuard in 2020 Dexa: Completed in 2022  BP Readings from Last 3 Encounters:  05/06/22 132/88  11/08/21 136/78  08/22/21 120/82        Review of Systems  Respiratory:  Negative for shortness of breath.   Cardiovascular:   Negative for chest pain.  Gastrointestinal:  Negative for constipation and diarrhea.  Musculoskeletal:  Positive for arthralgias.  Neurological:  Negative for headaches.  Psychiatric/Behavioral:  The patient is not nervous/anxious.         Past Medical History:  Diagnosis Date   Arthritis    Depression    Disruption of rectum ([place]) 07/16/2015   Diverticulosis    found via colonoscopy   Internal hemorrhoids    Rectal abscess    Urinary tract infection     Social History   Socioeconomic History   Marital status: Married    Spouse name: Not on file   Number of children: 1   Years of education: PhD   Highest education level: Not on file  Occupational History   Occupation: Professor    Employer: [employer]    Comment: Department chair for [employer]  Tobacco Use   Smoking status: Former    Years: 20.00    Types: Cigarettes    Quit date: 12/08/1992    Years since quitting: 29.4   Smokeless tobacco: Never  Substance and Sexual Activity   Alcohol use: Yes    Comment: Occasional Wine   Drug use: No   Sexual activity: Not on file  Other Topics Concern   Not on file  Social History Narrative   Married.  1 child.   Works at [employer], teaches Social Work.   Enjoys gardening, walking her dog.    Social Determinants of Health   Financial Resource Strain: Not on file  Food Insecurity: Not on file  Transportation Needs: Not on file  Physical Activity: Not on file  Stress: Not on file  Social Connections: Not on file  Intimate Partner Violence: Not on file    Past Surgical History:  Procedure Laterality Date   ABDOMINAL HYSTERECTOMY  2002   Due to Fibroids   EXCISION OF ADNEXAL MASS Right 04/2005   OOPHORECTOMY Bilateral    Rectal tear  2009   Rectal Abscess    Family History  Problem Relation Age of Onset   Dementia Father    Congestive Heart Failure Father    Thyroid disease Sister    Suicidality Brother    Breast cancer Maternal Grandmother     Breast cancer Mother 21       dx twice 43 and 71   Arrhythmia Sister    Hypothyroidism Sister     Allergies  Allergen Reactions   Sulfamethoxazole-Trimethoprim     Current Outpatient Medications on File Prior to Visit  Medication Sig Dispense Refill   calcium carbonate (OSCAL) 1500 (600 Ca) MG TABS tablet Take 1,500 mg by mouth daily with breakfast.     UNABLE TO FIND Take 1 capsule by mouth daily. Med Name: Natuerlo Whole Food Multivitamin for Women 50+     UNABLE TO FIND Take 1 capsule by mouth daily. Med Name: Natuerlo B-Complex with CoQ10     UNABLE TO FIND Take 1 capsule by mouth daily. Med Name: Natuerlo Vegan B12     UNABLE TO FIND Take 1 capsule by mouth daily. Med Name: Naturelo Vitamin D3     UNABLE TO FIND Take 1 capsule by mouth daily. Med Name: Natuerlo Turmeric & Ginger Root     No current facility-administered medications on file prior to visit.    BP 132/88   Pulse 69   Temp 98.6 F (37 C) (Oral)   Ht 5\' 4"  (1.626 m)   Wt 158 lb (71.7 kg)   SpO2 97%   BMI 27.12 kg/m  Objective:   Physical Exam Cardiovascular:     Rate and Rhythm: Normal rate and regular rhythm.  Pulmonary:     Effort: Pulmonary effort is normal.     Breath sounds: Normal breath sounds.  Abdominal:     General: Bowel sounds are normal.     Palpations: Abdomen is soft.     Tenderness: There is no abdominal tenderness.  Musculoskeletal:     Cervical back: Neck supple.  Skin:    General: Skin is warm and dry.     Comments: 0.5 cm oval, soft, immobile, subcutaneous mass to right lateral neck/shoulder.   Psychiatric:        Mood and Affect: Mood normal.          Assessment & Plan:

## 2022-05-06 NOTE — Assessment & Plan Note (Signed)
Continue calcium and vitamin D. Repeat bone density scan due in 2024

## 2022-05-07 ENCOUNTER — Other Ambulatory Visit (INDEPENDENT_AMBULATORY_CARE_PROVIDER_SITE_OTHER): Payer: Medicare Other

## 2022-05-07 DIAGNOSIS — R739 Hyperglycemia, unspecified: Secondary | ICD-10-CM

## 2022-05-07 LAB — HEMOGLOBIN A1C: Hgb A1c MFr Bld: 5.5 % (ref 4.6–6.5)

## 2022-05-20 ENCOUNTER — Ambulatory Visit
Admission: RE | Admit: 2022-05-20 | Discharge: 2022-05-20 | Disposition: A | Discharge status: Home / Self Care | Payer: Medicare Other | Source: Ambulatory Visit | Attending: Primary Care | Admitting: Primary Care

## 2022-05-20 DIAGNOSIS — Z1231 Encounter for screening mammogram for malignant neoplasm of breast: Secondary | ICD-10-CM | POA: Insufficient documentation

## 2022-05-28 ENCOUNTER — Telehealth: Payer: Self-pay | Admitting: Primary Care

## 2022-05-28 ENCOUNTER — Encounter: Payer: Self-pay | Admitting: *Deleted

## 2022-05-28 LAB — COLOGUARD: COLOGUARD: NEGATIVE

## 2022-05-28 NOTE — Telephone Encounter (Signed)
Pt called wanting to get seen today, her symptoms were dizziness and nausea. I transferred her to Access Nurse to speak with someone, please advise when possible.  Callback Number: [phone]

## 2022-05-28 NOTE — Telephone Encounter (Signed)
Any update on access nurse? Would recommend office visit for patient.

## 2022-05-28 NOTE — Telephone Encounter (Signed)
Checked did not see note yet

## 2022-05-29 NOTE — Telephone Encounter (Signed)
No notes from access nurse seen; I called pt and pt notified as instructed by Dr Elmyra Ricks note and she said she did not want to talk with a nurse outside of office so pt did not speak with Access nurse and pt said some time ago ENT and Allayne Gitelman NP had advised pt to get Flonase and Sudafed and pt had not done that until 05/28/22 and pt said today she is feeling better and does not need appt. Pt said would cb for appt if needed. Sending note to Dr Selena Batten and Park Meo CMA.

## 2022-05-29 NOTE — Telephone Encounter (Signed)
Did not ever seen note can you check and see if it has came across

## 2022-07-03 ENCOUNTER — Ambulatory Visit (INDEPENDENT_AMBULATORY_CARE_PROVIDER_SITE_OTHER): Payer: Medicare Other

## 2022-07-03 VITALS — Wt 158.0 lb

## 2022-07-03 DIAGNOSIS — Z0000 Encounter for general adult medical examination without abnormal findings: Secondary | ICD-10-CM | POA: Diagnosis not present

## 2022-07-03 NOTE — Progress Notes (Signed)
Virtual Visit via Telephone Note  I connected with  Patricia Hicks on 07/03/22 at 11:00 AM EDT by telephone and verified that I am speaking with the correct person using two identifiers.  Location: Patient: home Provider: [facility] Persons participating in the virtual visit: patient/Nurse Health Advisor   I discussed the limitations, risks, security and privacy concerns of performing an evaluation and management service by telephone and the availability of in person appointments. The patient expressed understanding and agreed to proceed.  Interactive audio and video telecommunications were attempted between this nurse and patient, however failed, due to patient having technical difficulties OR patient did not have access to video capability.  We continued and completed visit with audio only.  Some vital signs may be absent or patient reported.   Hal Hope, LPN  Subjective:   Patricia Hicks is a 66 y.o. female who presents for Medicare Annual (Subsequent) preventive examination.  Review of Systems     Cardiac Risk Factors include: advanced age (>87men, >59 women)     Objective:    There were no vitals filed for this visit. There is no height or weight on file to calculate BMI.     07/03/2022   10:59 AM 07/26/2019   10:31 PM 07/16/2015    1:36 PM  Advanced Directives  Does Patient Have a Medical Advance Directive? No No No  Would patient like information on creating a medical advance directive? No - Patient declined No - Patient declined     Current Medications (verified) Outpatient Encounter Medications as of 07/03/2022  Medication Sig   calcium carbonate (OSCAL) 1500 (600 Ca) MG TABS tablet Take 1,500 mg by mouth daily with breakfast.   UNABLE TO FIND Take 1 capsule by mouth daily. Med Name: Natuerlo Whole Food Multivitamin for Women 50+   UNABLE TO FIND Take 1 capsule by mouth daily. Med Name: Natuerlo B-Complex with CoQ10   UNABLE TO FIND Take 1 capsule by mouth  daily. Med Name: Natuerlo Vegan B12   UNABLE TO FIND Take 1 capsule by mouth daily. Med Name: Natuerlo Turmeric & Ginger Root   UNABLE TO FIND Take 1 capsule by mouth daily. Med Name: Vickey Sages Vitamin D3 (Patient not taking: Reported on 07/03/2022)   No facility-administered encounter medications on file as of 07/03/2022.    Allergies (verified) Sulfamethoxazole-trimethoprim   History: Past Medical History:  Diagnosis Date   Arthritis    Depression    Disruption of rectum (HCC) 07/16/2015   Diverticulosis    found via colonoscopy   Internal hemorrhoids    Rectal abscess    Urinary tract infection    Past Surgical History:  Procedure Laterality Date   ABDOMINAL HYSTERECTOMY  2002   Due to Fibroids   EXCISION OF ADNEXAL MASS Right 04/2005   OOPHORECTOMY Bilateral    Rectal tear  2009   Rectal Abscess   Family History  Problem Relation Age of Onset   Dementia Father    Congestive Heart Failure Father    Thyroid disease Sister    Suicidality Brother    Breast cancer Maternal Grandmother    Breast cancer Mother 46       dx twice 77 and 38   Arrhythmia Sister    Hypothyroidism Sister    Social History   Socioeconomic History   Marital status: Married    Spouse name: Not on file   Number of children: 1   Years of education: PhD   Highest education level: Not  on file  Occupational History   Occupation: Professor    Associate Professor: [employer]    Comment: Department chair for [employer]  Tobacco Use   Smoking status: Former    Years: 20.00    Types: Cigarettes    Quit date: 12/08/1992    Years since quitting: 29.5   Smokeless tobacco: Never  Substance and Sexual Activity   Alcohol use: Yes    Comment: Occasional Wine   Drug use: No   Sexual activity: Not on file  Other Topics Concern   Not on file  Social History Narrative   Married.   1 child.   Works at [employer], teaches Social Work.   Enjoys gardening, walking her dog.    Social Determinants of Health    Financial Resource Strain: Low Risk  (07/03/2022)   Overall Financial Resource Strain (CARDIA)    Difficulty of Paying Living Expenses: Not hard at all  Food Insecurity: No Food Insecurity (07/03/2022)   Hunger Vital Sign    Worried About Running Out of Food in the Last Year: Never true    Ran Out of Food in the Last Year: Never true  Transportation Needs: No Transportation Needs (07/03/2022)   PRAPARE - Administrator, Civil Service (Medical): No    Lack of Transportation (Non-Medical): No  Physical Activity: Sufficiently Active (07/03/2022)   Exercise Vital Sign    Days of Exercise per Week: 5 days    Minutes of Exercise per Session: 60 min  Stress: No Stress Concern Present (07/03/2022)   [employer] of Occupational Health - Occupational Stress Questionnaire    Feeling of Stress : Not at all  Social Connections: Moderately Isolated (07/03/2022)   Social Connection and Isolation Panel [NHANES]    Frequency of Communication with Friends and Family: More than three times a week    Frequency of Social Gatherings with Friends and Family: Once a week    Attends Religious Services: Never    Database administrator or Organizations: No    Attends Engineer, structural: Never    Marital Status: Married    Tobacco Counseling Counseling given: Not Answered   Clinical Intake:  Pre-visit preparation completed: Yes  Pain : No/denies pain     Nutritional Risks: None Diabetes: No  How often do you need to have someone help you when you read instructions, pamphlets, or other written materials from your doctor or pharmacy?: 1 - Never  Diabetic?no  Interpreter Needed?: No  Information entered by :: Kennedy Bucker, LPN   Activities of Daily Living    07/03/2022   11:01 AM 05/06/2022    7:58 AM  In your present state of health, do you have any difficulty performing the following activities:  Hearing? 0 0  Vision? 0 0  Difficulty concentrating or making  decisions? 0 0  Walking or climbing stairs? 1 0  Dressing or bathing? 0 0  Doing errands, shopping? 0 0  Preparing Food and eating ? N   Using the Toilet? N   In the past six months, have you accidently leaked urine? N   Do you have problems with loss of bowel control? N   Managing your Medications? N   Managing your Finances? N   Housekeeping or managing your Housekeeping? N     Patient Care Team: Doreene Nest, NP as PCP - General (Internal Medicine)  Indicate any recent Medical Services you may have received from other than [facility] providers in the  past year (date may be approximate).     Assessment:   This is a routine wellness examination for Patricia Hicks.  Hearing/Vision screen Hearing Screening - Comments:: No aids Vision Screening - Comments:: Readers- [facility]  Dietary issues and exercise activities discussed: Current Exercise Habits: Home exercise routine, Type of exercise: walking, Time (Minutes): 60, Frequency (Times/Week): 5, Weekly Exercise (Minutes/Week): 300, Intensity: Moderate   Goals Addressed             This Visit's Progress    DIET - EAT MORE FRUITS AND VEGETABLES         Depression Screen    07/03/2022   10:57 AM 05/06/2022    7:57 AM 05/03/2021   10:54 AM 11/12/2020   12:15 PM  PHQ 2/9 Scores  PHQ - 2 Score 0 0 0 0  PHQ- 9 Score 0 0  0    Fall Risk    07/03/2022   11:00 AM 05/06/2022    7:58 AM 05/03/2021   10:53 AM 03/27/2021    1:00 PM 11/12/2020   12:15 PM  Fall Risk   Falls in the past year? 0 0 0 1 0  Number falls in past yr: 0 0  0 0  Injury with Fall? 0 0  0 0  Risk for fall due to : No Fall Risks   Impaired balance/gait   Follow up Falls evaluation completed        FALL RISK PREVENTION PERTAINING TO THE HOME:  Any stairs in or around the home? Yes  If so, are there any without handrails? No  Home free of loose throw rugs in walkways, pet beds, electrical cords, etc? Yes  Adequate lighting in your home to reduce risk of  falls? Yes   ASSISTIVE DEVICES UTILIZED TO PREVENT FALLS:  Life alert? No  Use of a cane, walker or w/c? No  Grab bars in the bathroom? No  Shower chair or bench in shower? Yes  Elevated toilet seat or a handicapped toilet? Yes    Cognitive Function:       07/03/2022   11:01 AM  6CIT Screen  What Year? 0 points  What month? 0 points  What time? 0 points  Count back from 20 0 points  Months in reverse 0 points  Repeat phrase 0 points  Total Score 0 points    Immunizations Immunization History  Administered Date(s) Administered   Hepatitis A 11/24/2012   Hepatitis A, Adult 11/24/2012   Influenza-Unspecified 09/03/2020, 08/22/2021   PFIZER(Purple Top)SARS-COV-2 Vaccination 02/14/2020, 03/06/2020, 09/10/2020, 04/19/2021   PNEUMOCOCCAL CONJUGATE-20 05/03/2021   Pfizer Covid-19 Vaccine Bivalent Booster 2yrs & up 11/07/2021   Td 08/27/2004   Tdap 11/24/2012   Zoster Recombinat (Shingrix) 01/07/2019, 06/14/2019    TDAP status: Up to date  Flu Vaccine status: Up to date  Pneumococcal vaccine status: Up to date  Covid-19 vaccine status: Completed vaccines  Qualifies for Shingles Vaccine? Yes   Zostavax completed No   Shingrix Completed?: Yes  Screening Tests Health Maintenance  Topic Date Due   COVID-19 Vaccine (6 - Pfizer series) 03/08/2022   INFLUENZA VACCINE  07/08/2022   TETANUS/TDAP  11/24/2022   MAMMOGRAM  05/21/2023   Fecal DNA (Cologuard)  05/20/2025   Pneumonia Vaccine 35+ Years old  Completed   DEXA SCAN  Completed   Hepatitis C Screening  Completed   Zoster Vaccines- Shingrix  Completed   HPV VACCINES  Aged Out    Health Maintenance  Health Maintenance Due  Topic Date Due   COVID-19 Vaccine (6 - Pfizer series) 03/08/2022    Colorectal cancer screening: Type of screening: Cologuard. Completed 05/28/22. Repeat every 3 years  Mammogram status: Completed 05/20/22. Repeat every year  Bone Density status: Completed 04/24/21. Results reflect: Bone  density results: OSTEOPENIA. Repeat every 5 years.  Lung Cancer Screening: (Low Dose CT Chest recommended if Age 57-80 years, 30 pack-year currently smoking OR have quit w/in 15years.) does not qualify.    Additional Screening:  Hepatitis C Screening: does qualify; Completed 05/15/16  Vision Screening: Recommended annual ophthalmology exams for early detection of glaucoma and other disorders of the eye. Is the patient up to date with their annual eye exam?  Yes  Who is the provider or what is the name of the office in which the patient attends annual eye exams? [place]  If pt is not established with a provider, would they like to be referred to a provider to establish care? No .   Dental Screening: Recommended annual dental exams for proper oral hygiene  Community Resource Referral / Chronic Care Management: CRR required this visit?  No   CCM required this visit?  No      Plan:     I have personally reviewed and noted the following in the patient's chart:   Medical and social history Use of alcohol, tobacco or illicit drugs  Current medications and supplements including opioid prescriptions.  Functional ability and status Nutritional status Physical activity Advanced directives List of other physicians Hospitalizations, surgeries, and ER visits in previous 12 months Vitals Screenings to include cognitive, depression, and falls Referrals and appointments  In addition, I have reviewed and discussed with patient certain preventive protocols, quality metrics, and best practice recommendations. A written personalized care plan for preventive services as well as general preventive health recommendations were provided to patient.     Dionisio David, LPN   [id number]   Nurse Notes: none

## 2022-07-03 NOTE — Patient Instructions (Signed)
Patricia Hicks , Thank you for taking time to come for your Medicare Wellness Visit. I appreciate your ongoing commitment to your health goals. Please review the following plan we discussed and let me know if I can assist you in the future.   Screening recommendations/referrals: Colonoscopy: Cologuard 05/28/22 Mammogram: 05/20/22 Bone Density: 04/24/21 Recommended yearly ophthalmology/optometry visit for glaucoma screening and checkup Recommended yearly dental visit for hygiene and checkup  Vaccinations: Influenza vaccine: 08/22/21 Pneumococcal vaccine: 05/03/21 Tdap vaccine: 11/24/12 Shingles vaccine: Shingrix 01/07/19, 06/14/19   Covid-19:02/14/20, 03/06/20, 09/10/20, 11/07/21  Advanced directives: no  Conditions/risks identified: none  Next appointment: Follow up in one year for your annual wellness visit 07/07/23 @ 11 am by phone   Preventive Care 65 Years and Older, Female Preventive care refers to lifestyle choices and visits with your health care provider that can promote health and wellness. What does preventive care include? A yearly physical exam. This is also called an annual well check. Dental exams once or twice a year. Routine eye exams. Ask your health care provider how often you should have your eyes checked. Personal lifestyle choices, including: Daily care of your teeth and gums. Regular physical activity. Eating a healthy diet. Avoiding tobacco and drug use. Limiting alcohol use. Practicing safe sex. Taking low-dose aspirin every day. Taking vitamin and mineral supplements as recommended by your health care provider. What happens during an annual well check? The services and screenings done by your health care provider during your annual well check will depend on your age, overall health, lifestyle risk factors, and family history of disease. Counseling  Your health care provider may ask you questions about your: Alcohol use. Tobacco use. Drug use. Emotional  well-being. Home and relationship well-being. Sexual activity. Eating habits. History of falls. Memory and ability to understand (cognition). Work and work Astronomer. Reproductive health. Screening  You may have the following tests or measurements: Height, weight, and BMI. Blood pressure. Lipid and cholesterol levels. These may be checked every 5 years, or more frequently if you are over 30 years old. Skin check. Lung cancer screening. You may have this screening every year starting at age 72 if you have a 30-pack-year history of smoking and currently smoke or have quit within the past 15 years. Fecal occult blood test (FOBT) of the stool. You may have this test every year starting at age 32. Flexible sigmoidoscopy or colonoscopy. You may have a sigmoidoscopy every 5 years or a colonoscopy every 10 years starting at age 1. Hepatitis C blood test. Hepatitis B blood test. Sexually transmitted disease (STD) testing. Diabetes screening. This is done by checking your blood sugar (glucose) after you have not eaten for a while (fasting). You may have this done every 1-3 years. Bone density scan. This is done to screen for osteoporosis. You may have this done starting at age 16. Mammogram. This may be done every 1-2 years. Talk to your health care provider about how often you should have regular mammograms. Talk with your health care provider about your test results, treatment options, and if necessary, the need for more tests. Vaccines  Your health care provider may recommend certain vaccines, such as: Influenza vaccine. This is recommended every year. Tetanus, diphtheria, and acellular pertussis (Tdap, Td) vaccine. You may need a Td booster every 10 years. Zoster vaccine. You may need this after age 39. Pneumococcal 13-valent conjugate (PCV13) vaccine. One dose is recommended after age 31. Pneumococcal polysaccharide (PPSV23) vaccine. One dose is recommended after age 68. Talk  to your  health care provider about which screenings and vaccines you need and how often you need them. This information is not intended to replace advice given to you by your health care provider. Make sure you discuss any questions you have with your health care provider. Document Released: 12/21/2015 Document Revised: 08/13/2016 Document Reviewed: 09/25/2015 Elsevier Interactive Patient Education  2017 [place] Prevention in the Home Falls can cause injuries. They can happen to people of all ages. There are many things you can do to make your home safe and to help prevent falls. What can I do on the outside of my home? Regularly fix the edges of walkways and driveways and fix any cracks. Remove anything that might make you trip as you walk through a door, such as a raised step or threshold. Trim any bushes or trees on the path to your home. Use bright outdoor lighting. Clear any walking paths of anything that might make someone trip, such as rocks or tools. Regularly check to see if handrails are loose or broken. Make sure that both sides of any steps have handrails. Any raised decks and porches should have guardrails on the edges. Have any leaves, snow, or ice cleared regularly. Use sand or salt on walking paths during winter. Clean up any spills in your garage right away. This includes oil or grease spills. What can I do in the bathroom? Use night lights. Install grab bars by the toilet and in the tub and shower. Do not use towel bars as grab bars. Use non-skid mats or decals in the tub or shower. If you need to sit down in the shower, use a plastic, non-slip stool. Keep the floor dry. Clean up any water that spills on the floor as soon as it happens. Remove soap buildup in the tub or shower regularly. Attach bath mats securely with double-sided non-slip rug tape. Do not have throw rugs and other things on the floor that can make you trip. What can I do in the bedroom? Use night  lights. Make sure that you have a light by your bed that is easy to reach. Do not use any sheets or blankets that are too big for your bed. They should not hang down onto the floor. Have a firm chair that has side arms. You can use this for support while you get dressed. Do not have throw rugs and other things on the floor that can make you trip. What can I do in the kitchen? Clean up any spills right away. Avoid walking on wet floors. Keep items that you use a lot in easy-to-reach places. If you need to reach something above you, use a strong step stool that has a grab bar. Keep electrical cords out of the way. Do not use floor polish or wax that makes floors slippery. If you must use wax, use non-skid floor wax. Do not have throw rugs and other things on the floor that can make you trip. What can I do with my stairs? Do not leave any items on the stairs. Make sure that there are handrails on both sides of the stairs and use them. Fix handrails that are broken or loose. Make sure that handrails are as long as the stairways. Check any carpeting to make sure that it is firmly attached to the stairs. Fix any carpet that is loose or worn. Avoid having throw rugs at the top or bottom of the stairs. If you do have throw rugs,  attach them to the floor with carpet tape. Make sure that you have a light switch at the top of the stairs and the bottom of the stairs. If you do not have them, ask someone to add them for you. What else can I do to help prevent falls? Wear shoes that: Do not have high heels. Have rubber bottoms. Are comfortable and fit you well. Are closed at the toe. Do not wear sandals. If you use a stepladder: Make sure that it is fully opened. Do not climb a closed stepladder. Make sure that both sides of the stepladder are locked into place. Ask someone to hold it for you, if possible. Clearly mark and make sure that you can see: Any grab bars or handrails. First and last  steps. Where the edge of each step is. Use tools that help you move around (mobility aids) if they are needed. These include: Canes. Walkers. Scooters. Crutches. Turn on the lights when you go into a dark area. Replace any light bulbs as soon as they burn out. Set up your furniture so you have a clear path. Avoid moving your furniture around. If any of your floors are uneven, fix them. If there are any pets around you, be aware of where they are. Review your medicines with your doctor. Some medicines can make you feel dizzy. This can increase your chance of falling. Ask your doctor what other things that you can do to help prevent falls. This information is not intended to replace advice given to you by your health care provider. Make sure you discuss any questions you have with your health care provider. Document Released: 09/20/2009 Document Revised: 05/01/2016 Document Reviewed: 12/29/2014 Elsevier Interactive Patient Education  2017 [employer].

## 2022-09-04 ENCOUNTER — Telehealth: Payer: Self-pay | Admitting: Primary Care

## 2022-09-04 NOTE — Telephone Encounter (Signed)
Lft pt vm to call [phone] press option 3 and then 2 to sch . thanks 

## 2022-11-12 IMAGING — MG MM DIGITAL SCREENING BILAT W/ TOMO AND CAD
8 series · 8 of 24 positions shown · non-contrast
Comparison: Previous exam(s).

CLINICAL DATA: Screening.

EXAM:
DIGITAL SCREENING BILATERAL MAMMOGRAM WITH TOMOSYNTHESIS AND CAD
TECHNIQUE: Bilateral screening digital craniocaudal and mediolateral oblique
mammograms were obtained. Bilateral screening digital breast
tomosynthesis was performed. The images were evaluated with
computer-aided detection.

[R MLO synth-2D]
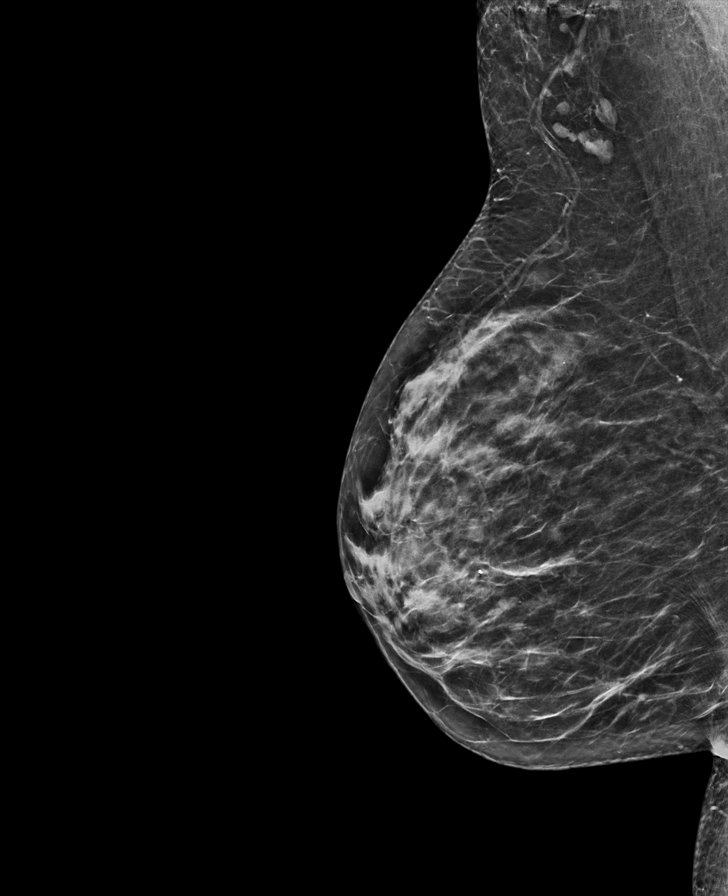

[R CC synth-2D]
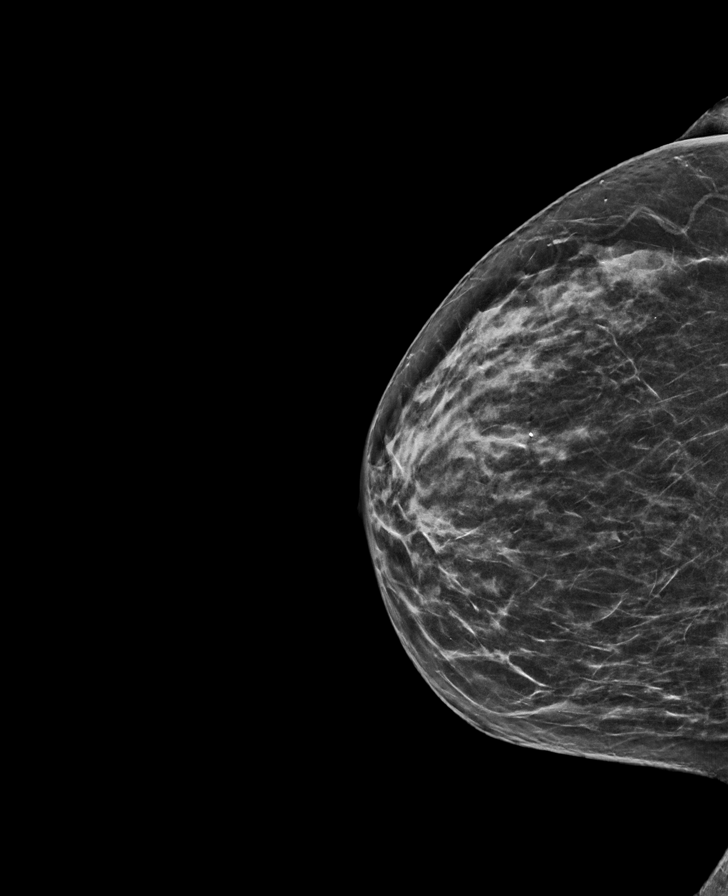

[L CC synth-2D]
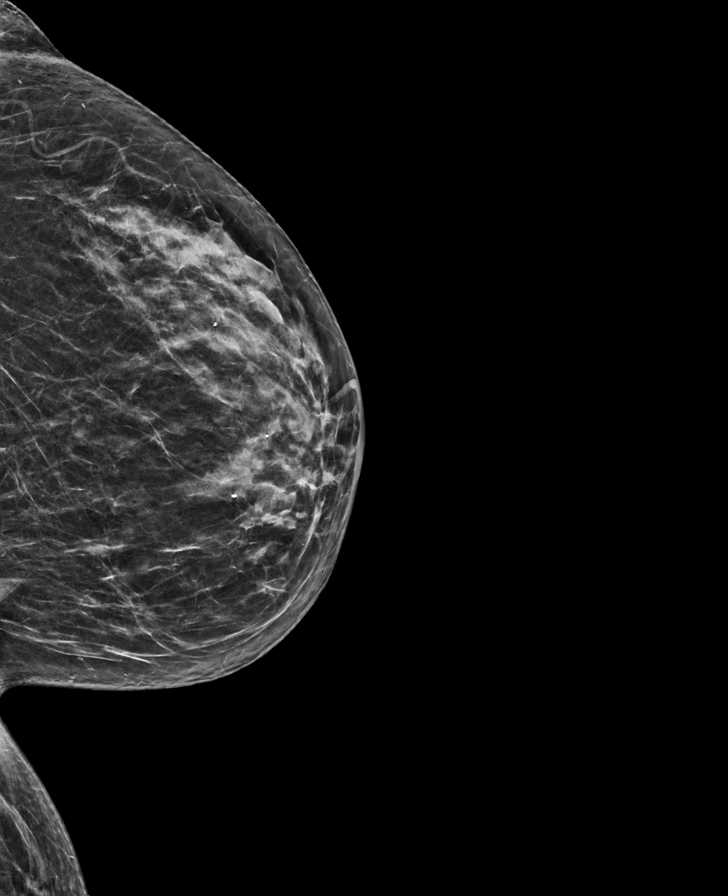

[L MLO synth-2D]
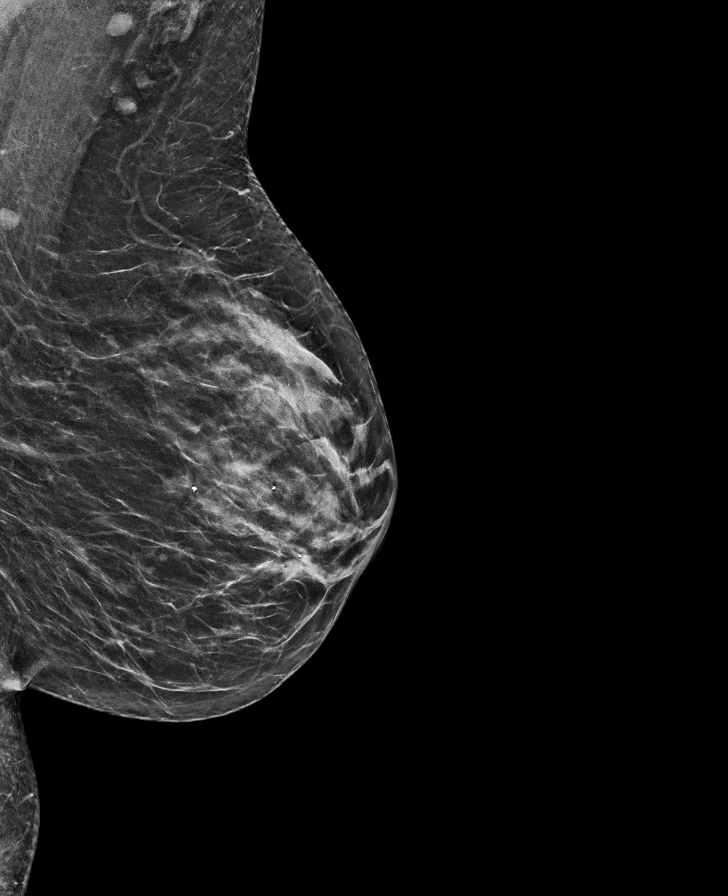

[L CC tomo · tomo slice 33/64.0]
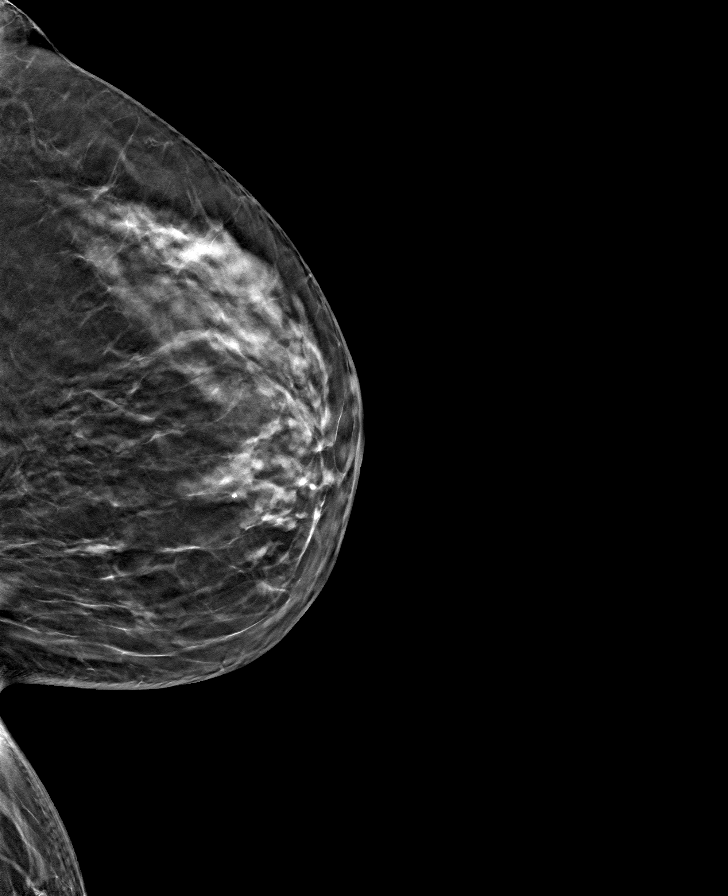

[R CC tomo · tomo slice 33/66.0]
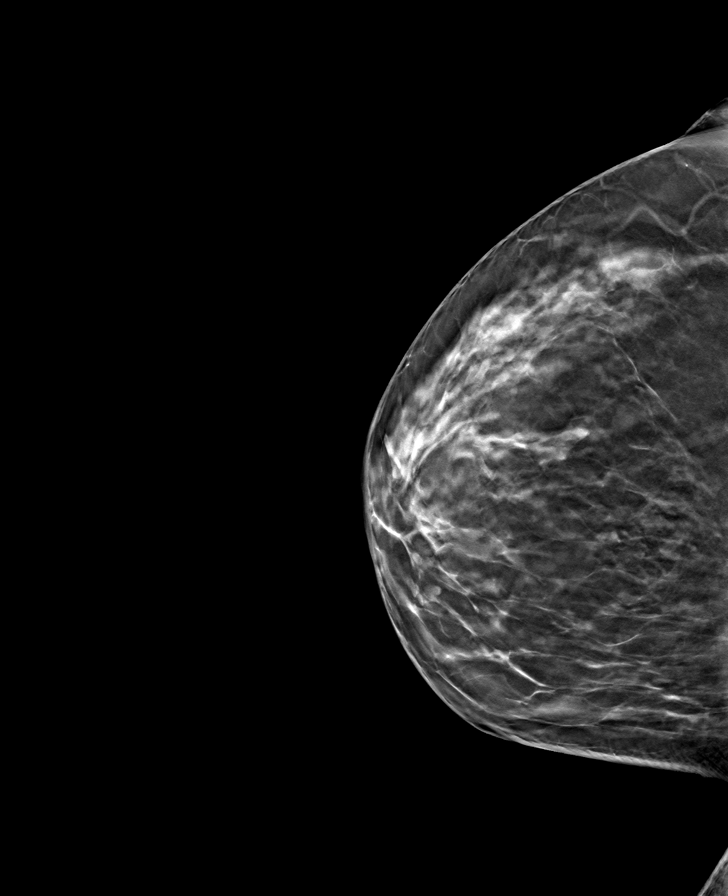

[R MLO tomo · tomo slice 35/68.0]
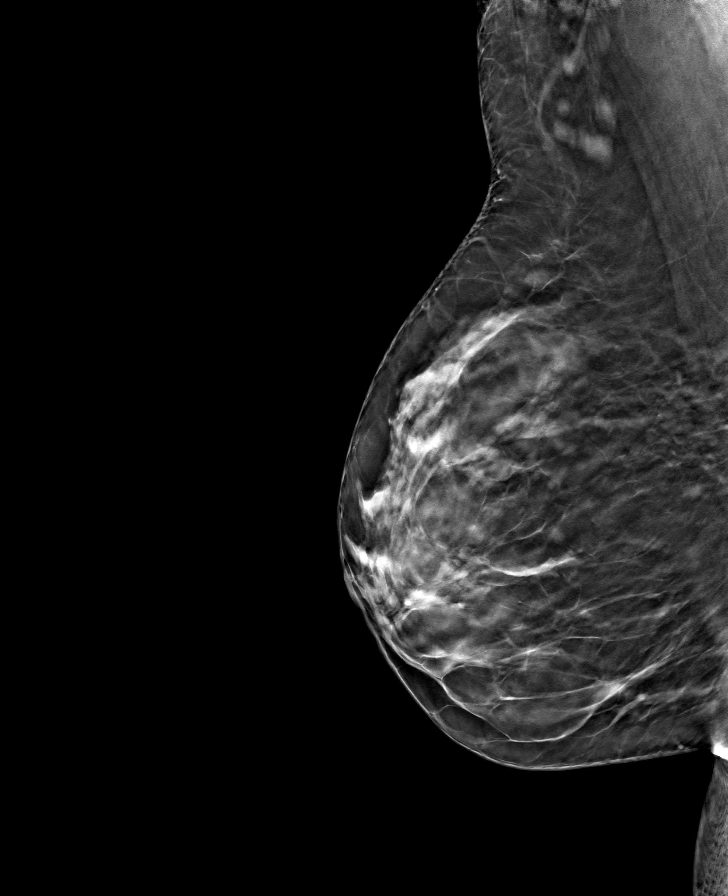

[L MLO tomo · tomo slice 35/68.0]
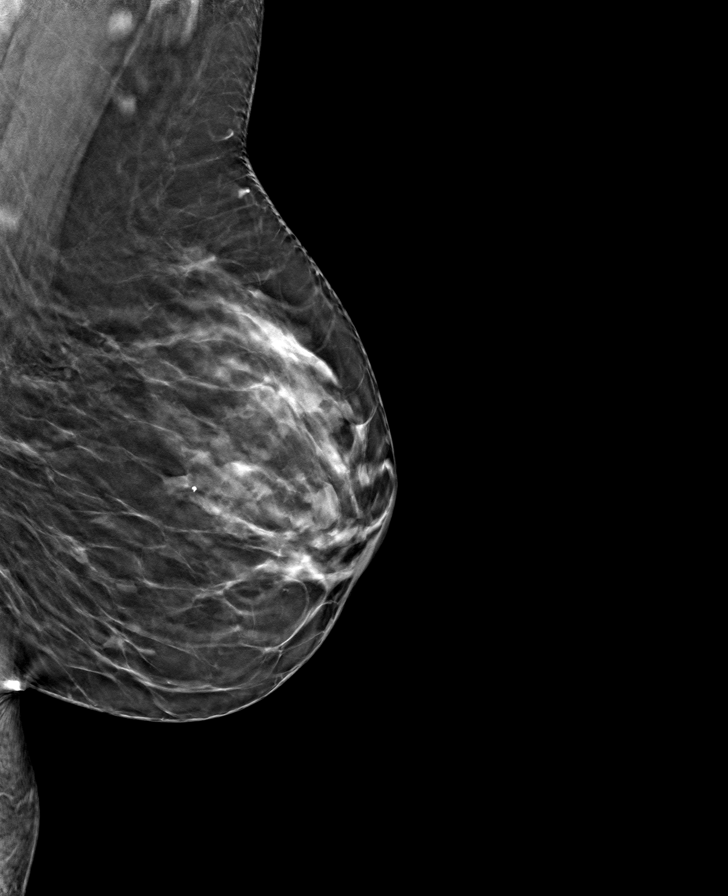

[8 of 24 positions shown; findings below may reference images not displayed]

ACR Breast Density Category c: The breast tissue is heterogeneously
dense, which may obscure small masses.
FINDINGS: There are no findings suspicious for malignancy.
IMPRESSION: No mammographic evidence of malignancy. A result letter of this
screening mammogram will be mailed directly to the patient.

RECOMMENDATION:
Screening mammogram in one year. (Code:[id number])

BI-RADS CATEGORY  1: Negative.

## 2022-12-10 ENCOUNTER — Other Ambulatory Visit: Payer: Self-pay

## 2022-12-10 ENCOUNTER — Emergency Department: Payer: Medicare Other

## 2022-12-10 ENCOUNTER — Emergency Department
Admission: EM | Admit: 2022-12-10 | Discharge: 2022-12-10 | Disposition: A | Discharge status: Home / Self Care | Payer: Medicare Other | Attending: Emergency Medicine | Admitting: Emergency Medicine

## 2022-12-10 DIAGNOSIS — M5412 Radiculopathy, cervical region: Secondary | ICD-10-CM | POA: Diagnosis not present

## 2022-12-10 DIAGNOSIS — M542 Cervicalgia: Secondary | ICD-10-CM | POA: Diagnosis present

## 2022-12-10 LAB — CBC WITH DIFFERENTIAL/PLATELET
Abs Immature Granulocytes: 0.04 10*3/uL (ref 0.00–0.07)
Basophils Absolute: 0 10*3/uL (ref 0.0–0.1)
Basophils Relative: 0 %
Eosinophils Absolute: 0 10*3/uL (ref 0.0–0.5)
Eosinophils Relative: 0 %
HCT: 42.3 % (ref 36.0–46.0)
Hemoglobin: 13.3 g/dL (ref 12.0–15.0)
Immature Granulocytes: 0 %
Lymphocytes Relative: 13 %
Lymphs Abs: 1.3 10*3/uL (ref 0.7–4.0)
MCH: 29 pg (ref 26.0–34.0)
MCHC: 31.4 g/dL (ref 30.0–36.0)
MCV: 92.2 fL (ref 80.0–100.0)
Monocytes Absolute: 0.8 10*3/uL (ref 0.1–1.0)
Monocytes Relative: 8 %
Neutro Abs: 7.8 10*3/uL — ABNORMAL HIGH (ref 1.7–7.7)
Neutrophils Relative %: 79 %
Platelets: 229 10*3/uL (ref 150–400)
RBC: 4.59 MIL/uL (ref 3.87–5.11)
RDW: 13 % (ref 11.5–15.5)
WBC: 10 10*3/uL (ref 4.0–10.5)
nRBC: 0 % (ref 0.0–0.2)

## 2022-12-10 LAB — TROPONIN I (HIGH SENSITIVITY)
Troponin I (High Sensitivity): 3 ng/L (ref <18)
Troponin I (High Sensitivity): 2 ng/L (ref <18)

## 2022-12-10 LAB — COMPREHENSIVE METABOLIC PANEL
ALT: 17 U/L (ref 0–44)
AST: 20 U/L (ref 15–41)
Albumin: 4.1 g/dL (ref 3.5–5.0)
Alkaline Phosphatase: 69 U/L (ref 38–126)
Anion gap: 9 (ref 5–15)
BUN: 9 mg/dL (ref 8–23)
CO2: 27 mmol/L (ref 22–32)
Calcium: 9.4 mg/dL (ref 8.9–10.3)
Chloride: 100 mmol/L (ref 98–111)
Creatinine, Ser: 0.58 mg/dL (ref 0.44–1.00)
GFR, Estimated: >60 mL/min (ref >60)
Glucose, Bld: 117 mg/dL — ABNORMAL HIGH (ref 70–99)
Potassium: 4 mmol/L (ref 3.5–5.1)
Sodium: 136 mmol/L (ref 135–145)
Total Bilirubin: 1 mg/dL (ref 0.3–1.2)
Total Protein: 7.5 g/dL (ref 6.5–8.1)

## 2022-12-10 MED ORDER — LIDOCAINE 5 % EX PTCH
1.0000 | MEDICATED_PATCH | CUTANEOUS | Status: DC
  Administered 2022-12-10: 1 via TRANSDERMAL
  Filled 2022-12-10: qty 1

## 2022-12-10 MED ORDER — MORPHINE SULFATE (PF) 4 MG/ML IV SOLN
4.0000 mg | Freq: Once | INTRAVENOUS | Status: AC
  Administered 2022-12-10: 4 mg via INTRAVENOUS
  Filled 2022-12-10: qty 1

## 2022-12-10 MED ORDER — CYCLOBENZAPRINE HCL 5 MG PO TABS: 5.0000 mg | ORAL_TABLET | Freq: Three times a day (TID) | ORAL | 0 refills | Status: DC | PRN

## 2022-12-10 MED ORDER — MORPHINE SULFATE (PF) 4 MG/ML IV SOLN
4.0000 mg | INTRAVENOUS | Status: DC | PRN
  Administered 2022-12-10: 4 mg via INTRAVENOUS
  Filled 2022-12-10 (×2): qty 1

## 2022-12-10 MED ORDER — LIDOCAINE 5 % EX PTCH: 1.0000 | MEDICATED_PATCH | Freq: Two times a day (BID) | CUTANEOUS | 0 refills | Status: AC

## 2022-12-10 NOTE — ED Provider Triage Note (Signed)
Emergency Medicine Provider Triage Evaluation Note  Letica Giaimo , a 67 y.o. female  was evaluated in triage.  Pt complains of neck stiffness for the last 3 days.  No known recent injury.  Also reports she has some tingling sensation down her right upper extremity.  CT scan cervical neck 2020 that showed moderate degenerative changes lower cervical spine.Evelina Bucy Percocet with some relief.  Review of Systems  Positive: Right upper extremity radiculopathy. Negative:   Physical Exam  BP 118/73 (BP Location: Left Arm)   Pulse 85   Temp 99.2 F (37.3 C) (Oral)   Resp 17   Ht 5\' 4"  (1.626 m)   Wt 72.5 kg   SpO2 99%   BMI 27.44 kg/m  Gen:   Awake, no distress   Resp:  Normal effort  MSK:   Moves extremities without difficulty  Other:    Medical Decision Making  Medically screening exam initiated at 10:35 AM.  Appropriate orders placed.  Alliana Mcauliff was informed that the remainder of the evaluation will be completed by another provider, this initial triage assessment does not replace that evaluation, and the importance of remaining in the ED until their evaluation is complete.     Johnn Hai, PA-C 12/10/22 1037

## 2022-12-10 NOTE — ED Notes (Signed)
First Nurse Note: Pt to ED via [facility] for active chest pain. Pt is in NAD.

## 2022-12-10 NOTE — ED Triage Notes (Signed)
Pain to right neck and upper back. Initially started on left side.  Today pain persists and right arm pain too.  Also c/o right eye visual changes this morning, states visual change similar to migraine pain.  Also c/o intermittent left breast pain over the past two days.  AAOx3. Skin warm and dry NAD  Neck and shoulders look stiff.  Patient had some extra percocet at home and has taken some with minimal relief.

## 2022-12-10 NOTE — ED Provider Notes (Signed)
Patient was signed out to me pending MRI cervical and thoracic spine.  Patient presenting with neck pain radiating down to the right arm.  MRI C-spine shows neuroforaminal stenosis right greater than left at multiple levels but no spinal canal stenosis.  No lesions that would require urgent intervention.  Discussed findings with the patient.  Recommended conservative management with Flexeril Tylenol Motrin and Lidoderm patch.  Discussed primary care follow-up and potential need to follow-up with neurosurgery if symptoms not improving.   Rada Hay, MD 12/10/22 [id number]

## 2022-12-10 NOTE — ED Provider Notes (Signed)
[facility] Provider Note    Event Date/Time   First MD Initiated Contact with Patient 12/10/22 1132     (approximate)   History   Chief Complaint Neck Pain  HPI  Patricia Hicks is a 67 y.o. female with past medical history of hyperlipidemia who presents to the ED complaining of neck pain.  Patient reports that yesterday she developed pain starting in the middle of her neck and radiating down the right side into her shoulder and arm.  She describes the pain as constant and sharp, also moving into the middle of her upper back.  Pain is exacerbated by certain movements and she has also had some numbness and tingling in her right hand.  She has not noticed any weakness in either arm or leg, does state that she feels stiff in her neck and right arm.  Pain has moved into her chest at times but she denies any fevers, cough, or difficulty breathing.  She also had an episode of "floaters" in the vision of her right eye with some headache, states this has since resolved and is described as similar to prior migraine headaches.  She reports issues with neck pain in the past, was previously told that she may have a pinched nerve or degenerative disc disease, but has never had an MRI.  She denies any recent falls or trauma to her head or neck.     Physical Exam   Triage Vital Signs: ED Triage Vitals  Enc Vitals Group     BP 12/10/22 1011 118/73     Pulse Rate 12/10/22 1011 85     Resp 12/10/22 1011 17     Temp 12/10/22 1011 99.2 F (37.3 C)     Temp Source 12/10/22 1011 Oral     SpO2 12/10/22 1011 99 %     Weight 12/10/22 1013 160 lb (72.6 kg)     Height 12/10/22 1013 5\' 4"  (1.626 m)     Head Circumference --      Peak Flow --      Pain Score 12/10/22 1017 8     Pain Loc --      Pain Edu? --      Excl. in [place]? --     Most recent vital signs: Vitals:   12/10/22 1011  BP: 118/73  Pulse: 85  Resp: 17  Temp: 99.2 F (37.3 C)  SpO2: 99%    Constitutional: Alert  and oriented. Eyes: Conjunctivae are normal. Head: Atraumatic. Nose: No congestion/rhinnorhea. Mouth/Throat: Mucous membranes are moist.  Neck: Midline cervical spine tenderness to palpation with tenderness extending over her right trapezius, no erythema, edema, or warmth noted. Cardiovascular: Normal rate, regular rhythm. Grossly normal heart sounds.  2+ radial pulses bilaterally. Respiratory: Normal respiratory effort.  No retractions. Lungs CTAB. Gastrointestinal: Soft and nontender. No distention. Musculoskeletal: No lower extremity tenderness nor edema.  Tenderness to palpation over midline thoracic spine, no tenderness noted at midline lumbar spine. Neurologic:  Normal speech and language. No gross focal neurologic deficits are appreciated.    ED Results / Procedures / Treatments   Labs (all labs ordered are listed, but only abnormal results are displayed) Labs Reviewed  CBC WITH DIFFERENTIAL/PLATELET - Abnormal; Notable for the following components:      Result Value   Neutro Abs 7.8 (*)    All other components within normal limits  COMPREHENSIVE METABOLIC PANEL - Abnormal; Notable for the following components:   Glucose, Bld 117 (*)  All other components within normal limits  TROPONIN I (HIGH SENSITIVITY)  TROPONIN I (HIGH SENSITIVITY)     EKG  ED ECG REPORT I, Blake Divine, the attending physician, personally viewed and interpreted this ECG.   Date: 12/10/2022  EKG Time: 10:09  Rate: 89  Rhythm: normal sinus rhythm  Axis: Normal  Intervals:none  ST&T Change: None  RADIOLOGY Chest x-ray reviewed and interpreted by me with no infiltrate, edema, or effusion.  PROCEDURES:  Critical Care performed: No  Procedures   MEDICATIONS ORDERED IN ED: Medications  lidocaine (LIDODERM) 5 % 1 patch (1 patch Transdermal Patch Applied 12/10/22 1412)  morphine (PF) 4 MG/ML injection 4 mg (has no administration in time range)  morphine (PF) 4 MG/ML injection 4 mg (4 mg  Intravenous Given 12/10/22 1251)     IMPRESSION / MDM / ASSESSMENT AND PLAN / ED COURSE  I reviewed the triage vital signs and the nursing notes.                              67 y.o. female with past medical history of hyperlipidemia presents to the ED with 24 hours of pain starting in her neck and radiating to her upper back, right arm, and chest with some tingling in her right hand.  Patient's presentation is most consistent with acute presentation with potential threat to life or bodily function.  Differential diagnosis includes, but is not limited to, ACS, PE, pneumonia, pneumothorax, cervical stenosis, cervical radiculopathy, meningitis, cervical strain.  Patient nontoxic-appearing and in no acute distress, vital signs are unremarkable.  She has subjective numbness and tingling in her right hand but no focal neurologic deficit noted on exam, strength appears intact.  She is neurovascular intact with 2+ radial pulse in her right arm.  EKG shows no evidence of arrhythmia or ischemia and I have low suspicion for ACS given her atypical symptoms.  Also low suspicion for PE given pain radiating down her arm and originating in her neck.  She has been previously worked up for carotid dissection with unremarkable imaging at this time, radiculopathy seems to be the most likely source of her symptoms and we will further assess with MRI, hold off on CTA today.  Chest x-ray is unremarkable, labs are pending.  We will treat symptomatically with IV morphine and Lidoderm patch.  Labs are unremarkable with no significant anemia, leukocytosis, electrolyte abnormality, or AKI.  Troponin within normal limits and I doubt ACS given atypical symptoms.  Patient is pending MRI, will give additional dose of morphine for pain control while she has to lie flat.  Patient turned over to oncoming provider pending MRI results and reassessment.      FINAL CLINICAL IMPRESSION(S) / ED DIAGNOSES   Final diagnoses:  Cervical  radiculopathy     Rx / DC Orders   ED Discharge Orders          Ordered    cyclobenzaprine (FLEXERIL) 5 MG tablet  3 times daily PRN        12/10/22 1501    lidocaine (LIDODERM) 5 %  Every 12 hours        12/10/22 1501             Note:  This document was prepared using Dragon voice recognition software and may include unintentional dictation errors.   Blake Divine, MD 12/10/22 [phone]

## 2022-12-12 ENCOUNTER — Telehealth: Payer: Self-pay

## 2022-12-12 NOTE — Telephone Encounter (Signed)
Transition Care Management Unsuccessful Follow-up Telephone Call  Date of discharge and from where:  [facility] 12/10/22  Attempts:  1st Attempt  Reason for unsuccessful TCM follow-up call:  Left voice message

## 2023-01-01 ENCOUNTER — Ambulatory Visit: Payer: Medicare Other

## 2023-03-26 ENCOUNTER — Other Ambulatory Visit: Payer: Self-pay | Admitting: Primary Care

## 2023-03-26 DIAGNOSIS — Z1231 Encounter for screening mammogram for malignant neoplasm of breast: Secondary | ICD-10-CM

## 2023-05-12 ENCOUNTER — Encounter: Payer: Self-pay | Admitting: Primary Care

## 2023-05-12 ENCOUNTER — Ambulatory Visit (INDEPENDENT_AMBULATORY_CARE_PROVIDER_SITE_OTHER): Payer: Medicare Other | Admitting: Primary Care

## 2023-05-12 VITALS — BP 134/82 | HR 56 | Temp 98.6°F | Ht 64.0 in | Wt 160.0 lb

## 2023-05-12 DIAGNOSIS — R739 Hyperglycemia, unspecified: Secondary | ICD-10-CM

## 2023-05-12 DIAGNOSIS — M8580 Other specified disorders of bone density and structure, unspecified site: Secondary | ICD-10-CM

## 2023-05-12 DIAGNOSIS — E2839 Other primary ovarian failure: Secondary | ICD-10-CM

## 2023-05-12 DIAGNOSIS — H938X2 Other specified disorders of left ear: Secondary | ICD-10-CM | POA: Diagnosis not present

## 2023-05-12 DIAGNOSIS — E785 Hyperlipidemia, unspecified: Secondary | ICD-10-CM | POA: Diagnosis not present

## 2023-05-12 DIAGNOSIS — M1990 Unspecified osteoarthritis, unspecified site: Secondary | ICD-10-CM

## 2023-05-12 LAB — LIPID PANEL
Cholesterol: 212 mg/dL — ABNORMAL HIGH (ref 0–200)
HDL: 71 mg/dL (ref >39.00)
LDL Cholesterol: 123 mg/dL — ABNORMAL HIGH (ref 0–99)
NonHDL: 141.34
Total CHOL/HDL Ratio: 3
Triglycerides: 93 mg/dL (ref 0.0–149.0)
VLDL: 18.6 mg/dL (ref 0.0–40.0)

## 2023-05-12 LAB — BASIC METABOLIC PANEL
BUN: 11 mg/dL (ref 6–23)
CO2: 29 mEq/L (ref 19–32)
Calcium: 9.4 mg/dL (ref 8.4–10.5)
Chloride: 102 mEq/L (ref 96–112)
Creatinine, Ser: 0.69 mg/dL (ref 0.40–1.20)
GFR: 89.51 mL/min (ref >60.00)
Glucose, Bld: 80 mg/dL (ref 70–99)
Potassium: 3.8 mEq/L (ref 3.5–5.1)
Sodium: 140 mEq/L (ref 135–145)

## 2023-05-12 LAB — HEMOGLOBIN A1C: Hgb A1c MFr Bld: 5.4 % (ref 4.6–6.5)

## 2023-05-12 NOTE — Progress Notes (Signed)
Subjective:    Patient ID: Patricia Hicks, female    DOB: [birth year], 67 y.o.   MRN: [id number]  HPI  Patricia Hicks is a very pleasant 67 y.o. female with a history of arthritis, osteopenia, hyperlipidemia, osteoarthritis who presents today for follow-up of chronic conditions.  1) Osteoarthritis: Chronic. Overall controlled. She underwent MR Cervical spine and thoracic spine in January 2024. Cervical spine with moderate right and mild left C5-6 neural foraminal stenosis, mild DDD to thoracic spine.   She remains active and does not have to take anything OTC. Evaluated previously by rheumatology and physical medicine.   2) Osteopenia: Currently managed on calcium and vitamin D. She is due for a bone density scan now. She is also exercising regularly, remains active at home.    Immunizations: -Tetanus: Completed in 2013 -Shingles: Completed Shingrix series -Pneumonia: Completed Prevnar 20 in 2022  Mammogram: Scheduled for May 22, 2023 Bone Density Scan: May 2022  Colonoscopy: Completed Cologuard in 2023, negative.  BP Readings from Last 3 Encounters:  05/12/23 134/82  12/10/22 133/81  05/06/22 132/88       Review of Systems  Constitutional:  Negative for unexpected weight change.  HENT:  Negative for rhinorrhea.   Respiratory:  Negative for cough and shortness of breath.   Cardiovascular:  Negative for chest pain.  Gastrointestinal:  Negative for constipation and diarrhea.  Genitourinary:  Negative for difficulty urinating.  Musculoskeletal:  Positive for arthralgias.  Skin:  Negative for rash.  Allergic/Immunologic: Negative for environmental allergies.  Neurological:  Negative for dizziness and headaches.  Psychiatric/Behavioral:  The patient is not nervous/anxious.          Past Medical History:  Diagnosis Date   Arthritis    Chest pressure 03/27/2021   Depression    Disruption of rectum (HCC) 07/16/2015   Diverticulosis    found via colonoscopy   Internal  hemorrhoids    Rectal abscess    Urinary tract infection     Social History   Socioeconomic History   Marital status: Married    Spouse name: Not on file   Number of children: 1   Years of education: PhD   Highest education level: Not on file  Occupational History   Occupation: Professor    Employer: [employer]    Comment: Department chair for [employer]  Tobacco Use   Smoking status: Former    Years: 20    Types: Cigarettes    Quit date: 12/08/1992    Years since quitting: 30.4   Smokeless tobacco: Never  Substance and Sexual Activity   Alcohol use: Yes    Comment: Occasional Wine   Drug use: No   Sexual activity: Not on file  Other Topics Concern   Not on file  Social History Narrative   Married.   1 child.   Works at [employer], teaches Social Work.   Enjoys gardening, walking her dog.    Social Determinants of Health   Financial Resource Strain: Low Risk  (07/03/2022)   Overall Financial Resource Strain (CARDIA)    Difficulty of Paying Living Expenses: Not hard at all  Food Insecurity: No Food Insecurity (07/03/2022)   Hunger Vital Sign    Worried About Running Out of Food in the Last Year: Never true    Ran Out of Food in the Last Year: Never true  Transportation Needs: No Transportation Needs (07/03/2022)   PRAPARE - Transportation    Lack of Transportation (Medical): No    Lack of  Transportation (Non-Medical): No  Physical Activity: Sufficiently Active (07/03/2022)   Exercise Vital Sign    Days of Exercise per Week: 5 days    Minutes of Exercise per Session: 60 min  Stress: No Stress Concern Present (07/03/2022)   [employer] of Occupational Health - Occupational Stress Questionnaire    Feeling of Stress : Not at all  Social Connections: Moderately Isolated (07/03/2022)   Social Connection and Isolation Panel [NHANES]    Frequency of Communication with Friends and Family: More than three times a week    Frequency of Social Gatherings with Friends  and Family: Once a week    Attends Religious Services: Never    Database administrator or Organizations: No    Attends Banker Meetings: Never    Marital Status: Married  Catering manager Violence: Not At Risk (07/03/2022)   Humiliation, Afraid, Rape, and Kick questionnaire    Fear of Current or Ex-Partner: No    Emotionally Abused: No    Physically Abused: No    Sexually Abused: No    Past Surgical History:  Procedure Laterality Date   ABDOMINAL HYSTERECTOMY  2002   Due to Fibroids   EXCISION OF ADNEXAL MASS Right 04/2005   OOPHORECTOMY Bilateral    Rectal tear  2009   Rectal Abscess    Family History  Problem Relation Age of Onset   Dementia Father    Congestive Heart Failure Father    Thyroid disease Sister    Suicidality Brother    Breast cancer Maternal Grandmother    Breast cancer Mother 69       dx twice 89 and 2   Arrhythmia Sister    Hypothyroidism Sister     Allergies  Allergen Reactions   Sulfamethoxazole-Trimethoprim     Current Outpatient Medications on File Prior to Visit  Medication Sig Dispense Refill   calcium carbonate (OSCAL) 1500 (600 Ca) MG TABS tablet Take 1,500 mg by mouth daily with breakfast.     UNABLE TO FIND Take 1 capsule by mouth daily. Med Name: Nat[facility]lo Whole Food Multivitamin for Women 50+     UNABLE TO FIND Take 1 capsule by mouth daily. Med Name: [facility]lo B-Complex with CoQ10     UNABLE TO FIND Take 1 capsule by mouth daily. Med Name: [facility] Vegan B12     UNABLE TO FIND Take 1 capsule by mouth daily. Med Name: Nat[facility]lo Turmeric & Ginger Root     cyclobenzaprine (FLEXERIL) 5 MG tablet Take 1 tablet (5 mg total) by mouth 3 (three) times daily as needed. (Patient not taking: Reported on 05/12/2023) 12 tablet 0   lidocaine (LIDODERM) 5 % Place 1 patch onto the skin every 12 (twelve) hours. Remove & Discard patch within 12 hours or as directed by MD (Patient not taking: Reported on 05/12/2023) 10 patch 0   UNABLE TO FIND Take  1 capsule by mouth daily. Med Name: Vickey Sages Vitamin D3 (Patient not taking: Reported on 07/03/2022)     No current facility-administered medications on file prior to visit.    BP 134/82   Pulse (!) 56   Temp 98.6 F (37 C) (Temporal)   Ht 5\' 4"  (1.626 m)   Wt 160 lb (72.6 kg)   SpO2 99%   BMI 27.46 kg/m  Objective:   Physical Exam Cardiovascular:     Rate and Rhythm: Normal rate and regular rhythm.  Pulmonary:     Effort: Pulmonary effort is normal.     Breath  sounds: Normal breath sounds.  Abdominal:     Palpations: Abdomen is soft.     Tenderness: There is no abdominal tenderness.  Musculoskeletal:     Cervical back: Neck supple.  Skin:    General: Skin is warm and dry.  Neurological:     Mental Status: She is alert.  Psychiatric:        Mood and Affect: Mood normal.           Assessment & Plan:  Hyperlipidemia, unspecified hyperlipidemia type Assessment & Plan: Repeat lipid panel pending. Not currently on treatment.  Orders: -     Basic metabolic panel -     Lipid panel  Estrogen deficiency -     DG Bone Density; Future  Hyperglycemia -     Hemoglobin A1c  Sensation of fullness in left ear Assessment & Plan: Chronic and continued.  She prefers not to use Flonase, discussed saline nasal spray. Also discussed antihistamine.   Arthritis Assessment & Plan: Osteoarthritis.  Reviewed MRI C-spine and thoracic spine from January 2024. Continue conservative treatment.  Continue cyclobenzaprine 5 mg as needed.   Osteopenia, unspecified location Assessment & Plan: Repeat bone density scan pending.  Continue calcium, vitamin D, weightbearing exercise         Doreene Nest, NP

## 2023-05-12 NOTE — Assessment & Plan Note (Signed)
Osteoarthritis.  Reviewed MRI C-spine and thoracic spine from January 2024. Continue conservative treatment.  Continue cyclobenzaprine 5 mg as needed.

## 2023-05-12 NOTE — Patient Instructions (Signed)
Stop by the lab prior to leaving today. I will notify you of your results once received.   [facility].   It was a pleasure to see you today!   

## 2023-05-12 NOTE — Assessment & Plan Note (Signed)
Repeat lipid panel pending.  Not currently on treatment. 

## 2023-05-12 NOTE — Assessment & Plan Note (Signed)
Repeat bone density scan pending.  Continue calcium, vitamin D, weightbearing exercise

## 2023-05-12 NOTE — Assessment & Plan Note (Signed)
Chronic and continued.  She prefers not to use Flonase, discussed saline nasal spray. Also discussed antihistamine.

## 2023-05-13 ENCOUNTER — Ambulatory Visit
Admission: RE | Admit: 2023-05-13 | Discharge: 2023-05-13 | Disposition: A | Discharge status: Home / Self Care | Payer: Medicare Other | Source: Ambulatory Visit | Attending: Primary Care | Admitting: Primary Care

## 2023-05-13 DIAGNOSIS — E2839 Other primary ovarian failure: Secondary | ICD-10-CM | POA: Insufficient documentation

## 2023-05-22 ENCOUNTER — Ambulatory Visit
Admission: RE | Admit: 2023-05-22 | Discharge: 2023-05-22 | Disposition: A | Discharge status: Home / Self Care | Payer: Medicare Other | Source: Ambulatory Visit | Attending: Primary Care | Admitting: Primary Care

## 2023-05-22 DIAGNOSIS — Z1231 Encounter for screening mammogram for malignant neoplasm of breast: Secondary | ICD-10-CM | POA: Diagnosis present

## 2024-01-01 ENCOUNTER — Ambulatory Visit (INDEPENDENT_AMBULATORY_CARE_PROVIDER_SITE_OTHER): Payer: Medicare Other

## 2024-01-01 VITALS — Ht 64.0 in | Wt 160.0 lb

## 2024-01-01 DIAGNOSIS — Z0000 Encounter for general adult medical examination without abnormal findings: Secondary | ICD-10-CM

## 2024-01-01 NOTE — Progress Notes (Signed)
Subjective:   Patricia Hicks is a 68 y.o. female who presents for Medicare Annual (Subsequent) preventive examination.  Visit Complete: In person  Patient Medicare AWV questionnaire was completed by the patient on 01/01/2024; I have confirmed that all information answered by patient is correct and no changes since this date.  Cardiac Risk Factors include: advanced age (>68men, >41 women);dyslipidemia    Objective:    Today's Vitals   01/01/24 0813 01/01/24 1530  Weight:  160 lb (72.6 kg)  Height:  5\' 4"  (1.626 m)  PainSc: 2     Body mass index is 27.46 kg/m.     01/01/2024    3:44 PM 12/10/2022   10:18 AM 07/03/2022   10:59 AM 07/26/2019   10:31 PM 07/16/2015    1:36 PM  Advanced Directives  Does Patient Have a Medical Advance Directive? No No No No No  Would patient like information on creating a medical advance directive?  No - Patient declined No - Patient declined No - Patient declined     Current Medications (verified) Outpatient Encounter Medications as of 01/01/2024  Medication Sig   calcium carbonate (OSCAL) 1500 (600 Ca) MG TABS tablet Take 1,500 mg by mouth daily with breakfast.   UNABLE TO FIND Take 1 capsule by mouth daily. Med Name: Natuerlo Whole Food Multivitamin for Women 50+   UNABLE TO FIND Take 1 capsule by mouth daily. Med Name: Natuerlo Vegan B12   UNABLE TO FIND Take 1 capsule by mouth daily. Med Name: Naturelo Vitamin D3   UNABLE TO FIND Take 1 capsule by mouth daily. Med Name: Natuerlo Turmeric & Ginger Root   cyclobenzaprine (FLEXERIL) 5 MG tablet Take 1 tablet (5 mg total) by mouth 3 (three) times daily as needed. (Patient not taking: Reported on 05/12/2023)   UNABLE TO FIND Take 1 capsule by mouth daily. Med Name: Kateri Mc B-Complex with CoQ10   No facility-administered encounter medications on file as of 01/01/2024.    Allergies (verified) Sulfamethoxazole-trimethoprim   History: Past Medical History:  Diagnosis Date   Arthritis    Chest pressure  03/27/2021   Depression    Disruption of rectum (HCC) 07/16/2015   Diverticulosis    found via colonoscopy   Internal hemorrhoids    Rectal abscess    Urinary tract infection    Past Surgical History:  Procedure Laterality Date   ABDOMINAL HYSTERECTOMY  2002   Due to Fibroids   EXCISION OF ADNEXAL MASS Right 04/2005   OOPHORECTOMY Bilateral    Rectal tear  2009   Rectal Abscess   Family History  Problem Relation Age of Onset   Dementia Father    Congestive Heart Failure Father    Thyroid disease Sister    Suicidality Brother    Breast cancer Maternal Grandmother    Breast cancer Mother 62       dx twice 77 and 51   Arrhythmia Sister    Hypothyroidism Sister    Social History   Socioeconomic History   Marital status: Married    Spouse name: Not on file   Number of children: 1   Years of education: PhD   Highest education level: Doctorate  Occupational History   Occupation: Professor    Employer: [employer]    Comment: Department chair for [employer]  Tobacco Use   Smoking status: Former    Current packs/day: 0.00    Types: Cigarettes    Start date: 12/08/1972    Quit date: 12/08/1992  Years since quitting: 31.0   Smokeless tobacco: Never  Substance and Sexual Activity   Alcohol use: Yes    Comment: Occasional Wine   Drug use: No   Sexual activity: Not on file  Other Topics Concern   Not on file  Social History Narrative   Married.   1 child.   Works at [employer], teaches Social Work.   Enjoys gardening, walking her dog.    Social Drivers of Corporate investment banker Strain: Low Risk  (01/01/2024)   Overall Financial Resource Strain (CARDIA)    Difficulty of Paying Living Expenses: Not hard at all  Food Insecurity: No Food Insecurity (01/01/2024)   Hunger Vital Sign    Worried About Running Out of Food in the Last Year: Never true    Ran Out of Food in the Last Year: Never true  Transportation Needs: No Transportation Needs (01/01/2024)    PRAPARE - Administrator, Civil Service (Medical): No    Lack of Transportation (Non-Medical): No  Physical Activity: Sufficiently Active (01/01/2024)   Exercise Vital Sign    Days of Exercise per Week: 5 days    Minutes of Exercise per Session: 30 min  Stress: No Stress Concern Present (01/01/2024)   [employer] of Occupational Health - Occupational Stress Questionnaire    Feeling of Stress : Not at all  Social Connections: Moderately Isolated (01/01/2024)   Social Connection and Isolation Panel [NHANES]    Frequency of Communication with Friends and Family: More than three times a week    Frequency of Social Gatherings with Friends and Family: Once a week    Attends Religious Services: Never    Database administrator or Organizations: No    Attends Engineer, structural: Not on file    Marital Status: Married    Tobacco Counseling Counseling given: Not Answered   Clinical Intake:  Pre-visit preparation completed: Yes  Pain : 0-10 Pain Score: 2  Pain Type: Chronic pain Pain Location: Generalized Pain Descriptors / Indicators: Aching Pain Onset: More than a month ago Pain Frequency: Intermittent Pain Relieving Factors: stretching  Pain Relieving Factors: stretching  BMI - recorded: 27.46 Nutritional Status: BMI 25 -29 Overweight Nutritional Risks: None Diabetes: No  How often do you need to have someone help you when you read instructions, pamphlets, or other written materials from your doctor or pharmacy?: 1 - Never  Interpreter Needed?: No  Comments: lives with husband Information entered by :: B.Johnpaul Gillentine,LPN   Activities of Daily Living    01/01/2024    8:13 AM  In your present state of health, do you have any difficulty performing the following activities:  Hearing? 0  Vision? 0  Difficulty concentrating or making decisions? 0  Walking or climbing stairs? 0  Dressing or bathing? 0  Doing errands, shopping? 0  Preparing Food and  eating ? N  Using the Toilet? N  In the past six months, have you accidently leaked urine? N  Do you have problems with loss of bowel control? N  Managing your Medications? N  Managing your Finances? N  Housekeeping or managing your Housekeeping? N    Patient Care Team: Doreene Nest, NP as PCP - General (Internal Medicine) [place], [facility])  Indicate any recent Medical Services you may have received from other than [facility] providers in the past year (date may be approximate).     Assessment:   This is a routine wellness examination for Patricia Hicks.  Hearing/Vision screen Hearing Screening - Comments:: Pt says her hearing is good Vision Screening - Comments:: Pt says her vision is alright;readers only [facility]   Goals Addressed             This Visit's Progress    COMPLETED: DIET - EAT MORE FRUITS AND VEGETABLES         Depression Screen    01/01/2024    3:39 PM 05/12/2023   12:14 PM 07/03/2022   10:57 AM 05/06/2022    7:57 AM 05/03/2021   10:54 AM 11/12/2020   12:15 PM  PHQ 2/9 Scores  PHQ - 2 Score 0 0 0 0 0 0  PHQ- 9 Score   0 0  0    Fall Risk    01/01/2024    8:13 AM 05/12/2023   12:04 PM 07/03/2022   11:00 AM 05/06/2022    7:58 AM 05/03/2021   10:53 AM  Fall Risk   Falls in the past year? 0 0 0 0 0  Number falls in past yr: 0 0 0 0   Injury with Fall? 0 0 0 0   Risk for fall due to : No Fall Risks No Fall Risks No Fall Risks    Follow up Education provided;Falls prevention discussed Falls evaluation completed Falls evaluation completed      MEDICARE RISK AT HOME: Medicare Risk at Home Any stairs in or around the home?: (Patient-Rptd) Yes If so, are there any without handrails?: (Patient-Rptd) Yes Home free of loose throw rugs in walkways, pet beds, electrical cords, etc?: (Patient-Rptd) Yes Adequate lighting in your home to reduce risk of falls?: (Patient-Rptd) Yes Life alert?: (Patient-Rptd) No Use of a cane, walker or w/c?:  (Patient-Rptd) No Grab bars in the bathroom?: (Patient-Rptd) No Elevated toilet seat or a handicapped toilet?: (Patient-Rptd) No  TIMED UP AND GO:  Was the test performed?  Yes  Length of time to ambulate 10 feet: 8 sec Gait steady and fast without use of assistive device    Cognitive Function:        01/01/2024    3:59 PM 07/03/2022   11:01 AM  6CIT Screen  What Year? 0 points 0 points  What month? 0 points 0 points  What time? 0 points 0 points  Count back from 20 0 points 0 points  Months in reverse 0 points 0 points  Repeat phrase 0 points 0 points  Total Score 0 points 0 points    Immunizations Immunization History  Administered Date(s) Administered   Hepatitis A 11/24/2012   Hepatitis A, Adult 11/24/2012   Influenza-Unspecified 09/03/2020, 08/22/2021   PFIZER(Purple Top)SARS-COV-2 Vaccination 02/14/2020, 03/06/2020, 09/10/2020, 04/19/2021   PNEUMOCOCCAL CONJUGATE-20 05/03/2021   Pfizer Covid-19 Vaccine Bivalent Booster 31yrs & up 11/07/2021   Td 08/27/2004   Tdap 11/24/2012   Zoster Recombinant(Shingrix) 01/07/2019, 06/14/2019    TDAP status: Up to date  Flu Vaccine status: Declined, Education has been provided regarding the importance of this vaccine but patient still declined. Advised may receive this vaccine at local pharmacy or Health Dept. Aware to provide a copy of the vaccination record if obtained from local pharmacy or Health Dept. Verbalized acceptance and understanding.  Pneumococcal vaccine status: Up to date  Covid-19 vaccine status: Completed vaccines  Qualifies for Shingles Vaccine? Yes   Zostavax completed Yes   Shingrix Completed?: Yes  Screening Tests Health Maintenance  Topic Date Due   DTaP/Tdap/Td (3 - Td or Tdap) 11/24/2022   INFLUENZA VACCINE  07/09/2023  COVID-19 Vaccine (6 - 2024-25 season) 08/09/2023   MAMMOGRAM  05/21/2024   Medicare Annual Wellness (AWV)  12/31/2024   Fecal DNA (Cologuard)  05/20/2025   Pneumonia Vaccine  18+ Years old  Completed   DEXA SCAN  Completed   Hepatitis C Screening  Completed   Zoster Vaccines- Shingrix  Completed   HPV VACCINES  Aged Out    Health Maintenance  Health Maintenance Due  Topic Date Due   DTaP/Tdap/Td (3 - Td or Tdap) 11/24/2022   INFLUENZA VACCINE  07/09/2023   COVID-19 Vaccine (6 - 2024-25 season) 08/09/2023    Colorectal cancer screening: Type of screening: Cologuard. Completed 05/20/2022. Repeat every 3 years  Mammogram status: Completed 05/22/2023. Repeat every year  Bone Density status: Completed 05/13/23. Results reflect: Bone density results: OSTEOPENIA. Repeat every 5 years.  Lung Cancer Screening: (Low Dose CT Chest recommended if Age 84-80 years, 20 pack-year currently smoking OR have quit w/in 15years.) does not qualify.   Lung Cancer Screening Referral: no  Additional Screening:  Hepatitis C Screening: does not qualify; Completed 05/15/2016  Vision Screening: Recommended annual ophthalmology exams for early detection of glaucoma and other disorders of the eye. Is the patient up to date with their annual eye exam?  Yes  Who is the provider or what is the name of the office in which the patient attends annual eye exams? [facility] If pt is not established with a provider, would they like to be referred to a provider to establish care? No .   Dental Screening: Recommended annual dental exams for proper oral hygiene  Diabetic Foot Exam:   Community Resource Referral / Chronic Care Management: CRR required this visit?  No   CCM required this visit?  No    Plan:     I have personally reviewed and noted the following in the patient's chart:   Medical and social history Use of alcohol, tobacco or illicit drugs  Current medications and supplements including opioid prescriptions. Patient is not currently taking opioid prescriptions. Functional ability and status Nutritional status Physical activity Advanced directives List of other  physicians Hospitalizations, surgeries, and ER visits in previous 12 months Vitals Screenings to include cognitive, depression, and falls Referrals and appointments  In addition, I have reviewed and discussed with patient certain preventive protocols, quality metrics, and best practice recommendations. A written personalized care plan for preventive services as well as general preventive health recommendations were provided to patient.   Sue Lush, LPN   1/61/0960   After Visit Summary: (MyChart) Due to this being a telephonic visit, the after visit summary with patients personalized plan was offered to patient via MyChart   Nurse Notes: The patient states she is doing well. She relay that she came to visit because she was called repeatedly. She also states she did not want to do the visit over the phone.. She relays she enjoyed the visit but does not desire to come for AWV beyond today. Pt declined weight and BP at visit. Noted in appts NOT to schedule for AWV w/nurse.

## 2024-01-01 NOTE — Patient Instructions (Signed)
Patricia Hicks , Thank you for taking time to come for your Medicare Wellness Visit. I appreciate your ongoing commitment to your health goals. Please review the following plan we discussed and let me know if I can assist you in the future.   Referrals/Orders/Follow-Ups/Clinician Recommendations: none  This is a list of the screening recommended for you and due dates:  Health Maintenance  Topic Date Due   DTaP/Tdap/Td vaccine (3 - Td or Tdap) 11/24/2022   Flu Shot  07/09/2023   COVID-19 Vaccine (6 - 2024-25 season) 08/09/2023   Mammogram  05/21/2024   Medicare Annual Wellness Visit  12/31/2024   Cologuard (Stool DNA test)  05/20/2025   Pneumonia Vaccine  Completed   DEXA scan (bone density measurement)  Completed   Hepatitis C Screening  Completed   Zoster (Shingles) Vaccine  Completed   HPV Vaccine  Aged Out    Advanced directives: (Declined) Advance directive discussed with you today. Even though you declined this today, please call our office should you change your mind, and we can give you the proper paperwork for you to fill out.  Next Medicare Annual Wellness Visit scheduled for next year: No pt does not desire AWV.She will let us know if she desires in the future.

## 2024-04-07 ENCOUNTER — Other Ambulatory Visit: Payer: Self-pay | Admitting: Primary Care

## 2024-04-07 DIAGNOSIS — Z1231 Encounter for screening mammogram for malignant neoplasm of breast: Secondary | ICD-10-CM

## 2024-04-15 ENCOUNTER — Ambulatory Visit: Payer: Self-pay

## 2024-04-15 NOTE — Telephone Encounter (Signed)
 Noted, agree with ED precautions this weekend. Will evaluate Monday

## 2024-04-15 NOTE — Telephone Encounter (Signed)
 Copied from CRM [id number]. Topic: Clinical - Red Word Triage >> Apr 15, 2024  4:01 PM Rosamond Comes wrote: Red Word that prompted transfer to Nurse Triage: patient calling BP swelling  Chief Complaint: low bP 89/57 Symptoms: BP 89/57, lightheadedness, dizzy, tired, moderate, bilateral hand and feet swelling, chest pain Pertinent Negatives: Patient denies chest pain at present times, no lightheadedness/dizzy or swelling at present time, n/v Disposition: [] ED /[] Urgent Care (no appt availability in office) / [] Appointment(In office/virtual)/ []  [facility]/ [x] Home Care/ [] Refused Recommended Disposition /[]  Mobile Bus/ []  Follow-up with PCP Additional Notes: last night BP 89/57: pt was lightheaded & dizzy: went to bed and felt better in morning: rechecked BP & it was normal for her. Pt has noticed last several weeks in AM &PM moderate  bilateral hand and bilateral feet swelling. Pt also states at times having chest pain in heart area only with exertion: comes and goes 4  or 5/10 pain: no chest present now. Nurse advised pt to check BP now: results at 5 minutes apart include:  118/75 & 118/70.  Pt has in office visit on Monday: Nurse informed pt if s/s re-occur and/or worsen go to ED this weekend.  Reason for Disposition  [1] Chest pain from known angina comes and goes AND [2] is NOT happening more often (increasing in frequency) or getting worse (increasing in severity)  [1] MILD swelling of both ankles (i.e., pedal edema) AND [2] new-onset or worsening  [1] MILD swelling (puffiness) of both hands AND [2] not better after 3 days  Answer Assessment - Initial Assessment Questions 1. ONSET: "When did the swelling start?" (e.g., minutes, hours, days)     2 to 3 weeks 2. LOCATION: "What part of the leg is swollen?"  "Are both legs swollen or just one leg?"     Bilateral feet swelling 3. SEVERITY: "How bad is the swelling?" (e.g., localized; mild, moderate, severe)   - Localized: Small  area of swelling localized to one leg.   - MILD pedal edema: Swelling limited to foot and ankle, pitting edema < 1/4 inch (6 mm) deep, rest and elevation eliminate most or all swelling.   - MODERATE edema: Swelling of lower leg to knee, pitting edema > 1/4 inch (6 mm) deep, rest and elevation only partially reduce swelling.   - SEVERE edema: Swelling extends above knee, facial or hand swelling present.      moderate 4. REDNESS: "Does the swelling look red or infected?"     no 5. PAIN: "Is the swelling painful to touch?" If Yes, ask: "How painful is it?"   (Scale 1-10; mild, moderate or severe)     no 6. FEVER: "Do you have a fever?" If Yes, ask: "What is it, how was it measured, and when did it start?"      no 7. CAUSE: "What do you think is causing the leg swelling?"     unknown 8. MEDICAL HISTORY: "Do you have a history of blood clots (e.g., DVT), cancer, heart failure, kidney disease, or liver failure?" Mitral   Value Prolase 9. RECURRENT SYMPTOM: "Have you had leg swelling before?" If Yes, ask: "When was the last time?" "What happened that time?"     no 10. OTHER SYMPTOMS: "Do you have any other symptoms?" (e.g., chest pain, difficulty breathing)       SOB-yesterday with exertion 11. PREGNANCY: "Is there any chance you are pregnant?" "When was your last menstrual period?"       N.a  Answer Assessment - Initial Assessment Questions 1. ONSET: "When did the swelling start?" (e.g., minutes, hours, days)     2-3 days 2. LOCATION: "What part of the hand is swollen?"  "Are both hands swollen or just one hand?"     Bilateral hand swelling 3. SEVERITY: "How bad is the swelling?" (e.g., localized; mild, moderate, severe)   - BALL OR LUMP: small ball or lump   - LOCALIZED: puffy or swollen area or patch of skin   - JOINT SWELLING: swelling of a joint   - MILD: puffiness or mild swelling of fingers or hand   - MODERATE: fingers and hand are swollen   - SEVERE: swelling of entire hand and up  into forearm     Moderate  4. REDNESS: "Does the swelling look red or infected?"     no 5. PAIN: "Is the swelling painful to touch?" If Yes, ask: "How painful is it?"   (Scale 1-10; mild, moderate or severe)     no 6. FEVER: "Do you have a fever?" If Yes, ask: "What is it, how was it measured, and when did it start?"      no 7. CAUSE: "What do you think is causing the hand swelling?" (e.g., heat, insect bite, pregnancy, recent injury)     N/a 8. MEDICAL HISTORY: "Do you have a history of heart failure, kidney disease, liver failure, or cancer?"     n 9. RECURRENT SYMPTOM: "Have you had hand swelling before?" If Yes, ask: "When was the last time?" "What happened that time?"     no 10. OTHER SYMPTOMS: "Do you have any other symptoms?" (e.g., blurred vision, difficulty breathing, headache)       SOB at times with exertion 11. PREGNANCY: "Is there any chance you are pregnant?" "When was your last menstrual period?"       N/a  Answer Assessment - Initial Assessment Questions 1. LOCATION: "Where does it hurt?"       Heart area 2. RADIATION: "Does the pain go anywhere else?" (e.g., into neck, jaw, arms, back)     no 3. ONSET: "When did the chest pain begin?" (Minutes, hours or days)      Comes and goes  4. PATTERN: "Does the pain come and go, or has it been constant since it started?"  "Does it get worse with exertion?"      N/a 5. DURATION: "How long does it last" (e.g., seconds, minutes, hours)     Very brief 6. SEVERITY: "How bad is the pain?"  (e.g., Scale 1-10; mild, moderate, or severe)    - MILD (1-3): doesn't interfere with normal activities     - MODERATE (4-7): interferes with normal activities or awakens from sleep    - SEVERE (8-10): excruciating pain, unable to do any normal activities       moderate 7. CARDIAC RISK FACTORS: "Do you have any history of heart problems or risk factors for heart disease?" (e.g., angina, prior heart attack; diabetes, high blood pressure, high  cholesterol, smoker, or strong family history of heart disease)     Mitral value prolase 8. PULMONARY RISK FACTORS: "Do you have any history of lung disease?"  (e.g., blood clots in lung, asthma, emphysema, birth control pills)     N/a 9. CAUSE: "What do you think is causing the chest pain?"     unknown 10. OTHER SYMPTOMS: "Do you have any other symptoms?" (e.g., dizziness, nausea, vomiting, sweating, fever, difficulty breathing, cough)  SOB w/ exertion 11. PREGNANCY: "Is there any chance you are pregnant?" "When was your last menstrual period?"       no  Answer Assessment - Initial Assessment Questions 1. BLOOD PRESSURE: "What is the blood pressure?" "Did you take at least two measurements 5 minutes apart?" 89/57 2. ONSET: "When did you take your blood pressure?"     yesterday 3. HOW: "How did you obtain the blood pressure?" (e.g., visiting nurse, automatic home BP monitor)     automatic 4. HISTORY: "Do you have a history of low blood pressure?" "What is your blood pressure normally?"     no 5. MEDICINES: "Are you taking any medications for blood pressure?" If Yes, ask: "Have they been changed recently?"     no 6. PULSE RATE: "Do you know what your pulse rate is?"      unknown 7. OTHER SYMPTOMS: "Have you been sick recently?" "Have you had a recent injury?"     Lightheadedness, dizzy, tired, SOB with exertion 8. PREGNANCY: "Is there any chance you are pregnant?" "When was your last menstrual period?"     no  Protocols used: Leg Swelling and Edema-A-AH, Hand Swelling-A-AH, Chest Pain-A-AH, Blood Pressure - Low-A-AH

## 2024-04-18 ENCOUNTER — Ambulatory Visit (INDEPENDENT_AMBULATORY_CARE_PROVIDER_SITE_OTHER): Admitting: Primary Care

## 2024-04-18 ENCOUNTER — Ambulatory Visit: Attending: Primary Care

## 2024-04-18 ENCOUNTER — Encounter: Payer: Self-pay | Admitting: Primary Care

## 2024-04-18 VITALS — BP 132/78 | HR 63 | Temp 97.8°F | Ht 64.0 in | Wt 159.0 lb

## 2024-04-18 DIAGNOSIS — R42 Dizziness and giddiness: Secondary | ICD-10-CM | POA: Insufficient documentation

## 2024-04-18 DIAGNOSIS — R001 Bradycardia, unspecified: Secondary | ICD-10-CM | POA: Insufficient documentation

## 2024-04-18 DIAGNOSIS — I340 Nonrheumatic mitral (valve) insufficiency: Secondary | ICD-10-CM

## 2024-04-18 DIAGNOSIS — R600 Localized edema: Secondary | ICD-10-CM

## 2024-04-18 LAB — CBC
HCT: 41 % (ref 36.0–46.0)
Hemoglobin: 13.3 g/dL (ref 12.0–15.0)
MCHC: 32.4 g/dL (ref 30.0–36.0)
MCV: 89.2 fl (ref 78.0–100.0)
Platelets: 213 10*3/uL (ref 150.0–400.0)
RBC: 4.59 Mil/uL (ref 3.87–5.11)
RDW: 13.6 % (ref 11.5–15.5)
WBC: 5.3 10*3/uL (ref 4.0–10.5)

## 2024-04-18 LAB — BASIC METABOLIC PANEL WITH GFR
BUN: 11 mg/dL (ref 6–23)
CO2: 28 meq/L (ref 19–32)
Calcium: 9.4 mg/dL (ref 8.4–10.5)
Chloride: 104 meq/L (ref 96–112)
Creatinine, Ser: 0.66 mg/dL (ref 0.40–1.20)
GFR: 89.88 mL/min (ref >60.00)
Glucose, Bld: 84 mg/dL (ref 70–99)
Potassium: 4.2 meq/L (ref 3.5–5.1)
Sodium: 141 meq/L (ref 135–145)

## 2024-04-18 LAB — BRAIN NATRIURETIC PEPTIDE: Pro B Natriuretic peptide (BNP): 42 pg/mL (ref 0.0–100.0)

## 2024-04-18 NOTE — Patient Instructions (Signed)
 Stop by the lab prior to leaving today. I will notify you of your results once received.   You will receive a phone call regarding the echocardiogram.  Complete the Holter monitor once received.  It was a pleasure to see you today!

## 2024-04-18 NOTE — Assessment & Plan Note (Addendum)
 Exam today without evidence of swelling or atrial fibrillation.   Orthostatic vitals negative today. ECG today with sinus bradycardia with rate of 53, no PAC/PVC, acute ST changes. ECG does appear similar to ECG from 2024 and 2022 but today's ECG is at a much slower rate.   Checking labs today including CBC, BNP and BMP. Will update echocardiogram given mitral valve incompetence. She agrees.  Given her bradycardia we will proceed with Holter Monitor. She agrees.   Work on hydrating when outdoors in the heat.

## 2024-04-18 NOTE — Assessment & Plan Note (Addendum)
 ECG confirms. This could be contributing to her symptoms.   Will order Holter Monitor for further evaluation Orders placed.

## 2024-04-18 NOTE — Progress Notes (Signed)
 Subjective:    Patient ID: Patricia Hicks, female    DOB: Feb 26, 1956, 68 y.o.   MRN: [id number]  Dizziness Pertinent negatives include no diaphoresis, nausea or vomiting.    Patricia Hicks is a very pleasant 68 y.o. female history of hyperlipidemia, vitamin B12 deficiency, joint who presents today to discuss lightheadedness/dizziness.  She contacted our office last week with reports of the evening blood pressure of 89/57 with symptoms of lightheadedness and dizziness.  The following morning she rechecked her blood pressure which was back to baseline.  Over the last several weeks she endorses moderate bilateral hand and foot swelling, and exertional chest pain.  Given the symptoms she was asked to come in for an office visit.  Today she discusses that over the past 3 weeks she's noticed intermittent lightheadedness, jittery, and bilateral foot swelling in the mornings and evenings. Symptoms improve during the day. She has been working in the garden over the last few weeks doing some strenuous work. She does hydrate with water while working. She does not believe she has heart chest pain, her chest pain was a sharp pain to th left chest when lifting a heavy object and walking.    Her lightheadedness has resolved as of 3 days ago. She does notice intermittent foot and hand swelling. No swelling today Today she feels well. She denies abdominal pain, epigastric pain, diaphoresis, nausea/vomiting, rectal/vaginal bleeding.   She's been checking her BP at home which is running 101-130/60-70s. Most of her BP readings are in the 110s/60s. She denies new supplements  She has cut out supplements, coffee, wine.    Review of Systems  Constitutional:  Negative for diaphoresis.  Gastrointestinal:  Negative for blood in stool, nausea and vomiting.  Genitourinary:  Negative for vaginal bleeding.  Neurological:  Positive for dizziness and light-headedness.         Past Medical History:  Diagnosis Date    Arthritis    Chest pressure 03/27/2021   Depression    Disruption of rectum (HCC) 07/16/2015   Diverticulosis    found via colonoscopy   Internal hemorrhoids    Rectal abscess    Urinary tract infection     Social History   Socioeconomic History   Marital status: Married    Spouse name: Not on file   Number of children: 1   Years of education: PhD   Highest education level: Doctorate  Occupational History   Occupation: Professor    Employer: [employer]    Comment: Department chair for [employer]  Tobacco Use   Smoking status: Former    Current packs/day: 0.00    Types: Cigarettes    Start date: 12/08/1972    Quit date: 12/08/1992    Years since quitting: 31.3   Smokeless tobacco: Never  Substance and Sexual Activity   Alcohol use: Yes    Comment: Occasional Wine   Drug use: No   Sexual activity: Not on file  Other Topics Concern   Not on file  Social History Narrative   Married.   1 child.   Works at [employer], teaches Social Work.   Enjoys gardening, walking her dog.    Social Drivers of Corporate investment banker Strain: Low Risk  (01/01/2024)   Overall Financial Resource Strain (CARDIA)    Difficulty of Paying Living Expenses: Not hard at all  Food Insecurity: No Food Insecurity (01/01/2024)   Hunger Vital Sign    Worried About Running Out of Food in the Last Year:  Never true    Ran Out of Food in the Last Year: Never true  Transportation Needs: No Transportation Needs (01/01/2024)   PRAPARE - Administrator, Civil Service (Medical): No    Lack of Transportation (Non-Medical): No  Physical Activity: Sufficiently Active (01/01/2024)   Exercise Vital Sign    Days of Exercise per Week: 5 days    Minutes of Exercise per Session: 30 min  Stress: No Stress Concern Present (01/01/2024)   [employer] of Occupational Health - Occupational Stress Questionnaire    Feeling of Stress : Not at all  Social Connections: Moderately Isolated (01/01/2024)    Social Connection and Isolation Panel [NHANES]    Frequency of Communication with Friends and Family: More than three times a week    Frequency of Social Gatherings with Friends and Family: Once a week    Attends Religious Services: Never    Database administrator or Organizations: No    Attends Engineer, structural: Not on file    Marital Status: Married  Catering manager Violence: Not At Risk (01/01/2024)   Humiliation, Afraid, Rape, and Kick questionnaire    Fear of Current or Ex-Partner: No    Emotionally Abused: No    Physically Abused: No    Sexually Abused: No    Past Surgical History:  Procedure Laterality Date   ABDOMINAL HYSTERECTOMY  2002   Due to Fibroids   EXCISION OF ADNEXAL MASS Right 04/2005   OOPHORECTOMY Bilateral    Rectal tear  2009   Rectal Abscess    Family History  Problem Relation Age of Onset   Dementia Father    Congestive Heart Failure Father    Thyroid  disease Sister    Suicidality Brother    Breast cancer Maternal Grandmother    Breast cancer Mother 53       dx twice 21 and 54   Arrhythmia Sister    Hypothyroidism Sister     Allergies  Allergen Reactions   Sulfamethoxazole-Trimethoprim     Current Outpatient Medications on File Prior to Visit  Medication Sig Dispense Refill   calcium carbonate (OSCAL) 1500 (600 Ca) MG TABS tablet Take 1,500 mg by mouth daily with breakfast.     UNABLE TO FIND Take 1 capsule by mouth daily. Med Name: Natuerlo Whole Food Multivitamin for Women 50+     UNABLE TO FIND Take 1 capsule by mouth daily. Med Name: Natuerlo B-Complex with CoQ10     UNABLE TO FIND Take 1 capsule by mouth daily. Med Name: Natuerlo Vegan B12     UNABLE TO FIND Take 1 capsule by mouth daily. Med Name: Naturelo Vitamin D3     UNABLE TO FIND Take 1 capsule by mouth daily. Med Name: Natuerlo Turmeric & Ginger Root     cyclobenzaprine  (FLEXERIL ) 5 MG tablet Take 1 tablet (5 mg total) by mouth 3 (three) times daily as needed.  (Patient not taking: Reported on 04/18/2024) 12 tablet 0   No current facility-administered medications on file prior to visit.    BP 132/78   Pulse 63   Temp 97.8 F (36.6 C) (Temporal)   Ht 5\' 4"  (1.626 m)   Wt 159 lb (72.1 kg)   SpO2 98%   BMI 27.29 kg/m  Objective:   Physical Exam Cardiovascular:     Rate and Rhythm: Normal rate and regular rhythm.  Pulmonary:     Effort: Pulmonary effort is normal.     Breath sounds: Normal  breath sounds.  Musculoskeletal:     Cervical back: Neck supple.  Skin:    General: Skin is warm and dry.  Neurological:     Mental Status: She is alert and oriented to person, place, and time.  Psychiatric:        Mood and Affect: Mood normal.           Assessment & Plan:  Dizziness Assessment & Plan: Exam today without evidence of swelling or atrial fibrillation.   Orthostatic vitals negative today. ECG today with sinus bradycardia with rate of 53, no PAC/PVC, acute ST changes. ECG does appear similar to ECG from 2024 and 2022 but today's ECG is at a much slower rate.   Checking labs today including CBC, BNP and BMP. Will update echocardiogram given mitral valve incompetence. She agrees.  Given her bradycardia we will proceed with Holter Monitor. She agrees.   Work on hydrating when outdoors in the heat.   Orders: -     EKG 12-Lead -     CBC -     Basic metabolic panel with GFR -     ECHOCARDIOGRAM COMPLETE; Future -     LONG TERM MONITOR (3-14 DAYS); Future  Mitral valve insufficiency, unspecified etiology -     ECHOCARDIOGRAM COMPLETE; Future  Extremity edema -     Brain natriuretic peptide  Sinus bradycardia Assessment & Plan: ECG confirms. This could be contributing to her symptoms.   Will order Holter Monitor for further evaluation Orders placed.   Orders: -     LONG TERM MONITOR (3-14 DAYS); Future        Ezekeil Bethel K Yeva Bissette, NP

## 2024-05-06 ENCOUNTER — Ambulatory Visit: Attending: Primary Care

## 2024-05-06 DIAGNOSIS — R42 Dizziness and giddiness: Secondary | ICD-10-CM | POA: Insufficient documentation

## 2024-05-06 DIAGNOSIS — I340 Nonrheumatic mitral (valve) insufficiency: Secondary | ICD-10-CM | POA: Diagnosis present

## 2024-05-10 ENCOUNTER — Ambulatory Visit: Payer: Self-pay | Admitting: Primary Care

## 2024-05-10 DIAGNOSIS — E785 Hyperlipidemia, unspecified: Secondary | ICD-10-CM

## 2024-05-10 LAB — ECHOCARDIOGRAM COMPLETE
AR max vel: 2.56 cm2
AV Area VTI: 2.7 cm2
AV Area mean vel: 2.54 cm2
AV Mean grad: 3 mmHg
AV Peak grad: 6 mmHg
Ao pk vel: 1.22 m/s
Area-P 1/2: 2.62 cm2
Calc EF: 55.9 %
S' Lateral: 3.3 cm
Single Plane A2C EF: 51.8 %
Single Plane A4C EF: 59.2 %

## 2024-05-12 ENCOUNTER — Ambulatory Visit (INDEPENDENT_AMBULATORY_CARE_PROVIDER_SITE_OTHER): Admitting: Primary Care

## 2024-05-12 ENCOUNTER — Encounter: Payer: Self-pay | Admitting: Primary Care

## 2024-05-12 VITALS — BP 124/70 | HR 65 | Temp 97.3°F | Ht 64.0 in | Wt 159.0 lb

## 2024-05-12 DIAGNOSIS — M8580 Other specified disorders of bone density and structure, unspecified site: Secondary | ICD-10-CM

## 2024-05-12 DIAGNOSIS — M2550 Pain in unspecified joint: Secondary | ICD-10-CM

## 2024-05-12 DIAGNOSIS — R42 Dizziness and giddiness: Secondary | ICD-10-CM | POA: Diagnosis not present

## 2024-05-12 DIAGNOSIS — E785 Hyperlipidemia, unspecified: Secondary | ICD-10-CM | POA: Diagnosis not present

## 2024-05-12 NOTE — Patient Instructions (Signed)
 Stop by the lab prior to leaving today. I will notify you of your results once received.   It was a pleasure to see you today!

## 2024-05-12 NOTE — Assessment & Plan Note (Signed)
 Stable.  Continue activity level and exercises.

## 2024-05-12 NOTE — Assessment & Plan Note (Signed)
 Resolved.  Reviewed echocardiogram from 2025. Await Holter monitor results.

## 2024-05-12 NOTE — Assessment & Plan Note (Signed)
 Improved!  Reviewed bone density scan from 2024. Continue calcium and vitamin D . Continue weightbearing exercise.  Repeat bone density scan in 2026

## 2024-05-12 NOTE — Assessment & Plan Note (Signed)
 Repeat lipid panel pending.  Continue regular exercise.

## 2024-05-12 NOTE — Progress Notes (Signed)
 Subjective:    Patient ID: Patricia Hicks, female    DOB: 04-24-1956, 68 y.o.   MRN: [id number]  HPI  Patricia Hicks is a very pleasant 68 y.o. female with a history of tachycardia, arthritis, osteopenia, hyperlipidemia, chronic joint pain who presents today for follow-up chronic conditions.   Immunizations: -Tetanus: Completed in 2013  -Shingles: Completed Shingrix series -Pneumonia: Completed Prevnar 20 in 2022  Mammogram: Completed in June 2024, scheduled for June 2025 Bone Density Scan: Completed in June 2024  Colonoscopy: Completed Cologuard in 2023, negative   1) Chronic Joint Pain: Chronic. Pain is mostly located to the hips, hands, knees, ankles, feet. She began Tai Chi recently which has helped. She is also active.   2) Hyperlipidemia: Currently not managed on treatment.  Last lipid panel in June 2024 with LDL of 123, HDL 71, Triggs of 93. She denies chest pain, dizziness.  3) Osteopenia: Last bone density scan was in June 2024 which showed improved t score to left femur neck from -1.5 in 2022 to -1.3 in 2025 and improved t score to left forearm from -2.3 in 2022 to -1.8 in 2025.  She is compliant to calcium and vitamin D  daily.  She is active and weightbearing exercise.    Review of Systems  Respiratory:  Negative for shortness of breath.   Cardiovascular:  Negative for chest pain.  Gastrointestinal:  Negative for constipation and diarrhea.  Musculoskeletal:  Positive for arthralgias.  Neurological:  Negative for dizziness.  Psychiatric/Behavioral:  The patient is not nervous/anxious.          Past Medical History:  Diagnosis Date   Arthritis    Chest pressure 03/27/2021   Depression    Disruption of rectum (HCC) 07/16/2015   Diverticulosis    found via colonoscopy   Internal hemorrhoids    Rectal abscess    Sensation of fullness in left ear 08/22/2021   Urinary tract infection     Social History   Socioeconomic History   Marital status: Married     Spouse name: Not on file   Number of children: 1   Years of education: PhD   Highest education level: Doctorate  Occupational History   Occupation: Professor    Associate Professor: [employer]    Comment: Department chair for [employer]  Tobacco Use   Smoking status: Former    Current packs/day: 0.00    Types: Cigarettes    Start date: 12/08/1972    Quit date: 12/08/1992    Years since quitting: 31.4   Smokeless tobacco: Never  Substance and Sexual Activity   Alcohol use: Yes    Comment: Occasional Wine   Drug use: No   Sexual activity: Not on file  Other Topics Concern   Not on file  Social History Narrative   Married.   1 child.   Works at [employer], teaches Social Work.   Enjoys gardening, walking her dog.    Social Drivers of Corporate investment banker Strain: Low Risk  (01/01/2024)   Overall Financial Resource Strain (CARDIA)    Difficulty of Paying Living Expenses: Not hard at all  Food Insecurity: No Food Insecurity (01/01/2024)   Hunger Vital Sign    Worried About Running Out of Food in the Last Year: Never true    Ran Out of Food in the Last Year: Never true  Transportation Needs: No Transportation Needs (01/01/2024)   PRAPARE - Transportation    Lack of Transportation (Medical): No    Lack  of Transportation (Non-Medical): No  Physical Activity: Sufficiently Active (01/01/2024)   Exercise Vital Sign    Days of Exercise per Week: 5 days    Minutes of Exercise per Session: 30 min  Stress: No Stress Concern Present (01/01/2024)   [employer] of Occupational Health - Occupational Stress Questionnaire    Feeling of Stress : Not at all  Social Connections: Moderately Isolated (01/01/2024)   Social Connection and Isolation Panel [NHANES]    Frequency of Communication with Friends and Family: More than three times a week    Frequency of Social Gatherings with Friends and Family: Once a week    Attends Religious Services: Never    Database administrator or Organizations:  No    Attends Engineer, structural: Not on file    Marital Status: Married  Catering manager Violence: Not At Risk (01/01/2024)   Humiliation, Afraid, Rape, and Kick questionnaire    Fear of Current or Ex-Partner: No    Emotionally Abused: No    Physically Abused: No    Sexually Abused: No    Past Surgical History:  Procedure Laterality Date   ABDOMINAL HYSTERECTOMY  2002   Due to Fibroids   EXCISION OF ADNEXAL MASS Right 04/2005   OOPHORECTOMY Bilateral    Rectal tear  2009   Rectal Abscess    Family History  Problem Relation Age of Onset   Dementia Father    Congestive Heart Failure Father    Thyroid  disease Sister    Suicidality Brother    Breast cancer Maternal Grandmother    Breast cancer Mother 23       dx twice 66 and 75   Arrhythmia Sister    Hypothyroidism Sister     Allergies  Allergen Reactions   Sulfamethoxazole-Trimethoprim     Current Outpatient Medications on File Prior to Visit  Medication Sig Dispense Refill   calcium carbonate (OSCAL) 1500 (600 Ca) MG TABS tablet Take 1,500 mg by mouth daily with breakfast.     UNABLE TO FIND Take 1 capsule by mouth daily. Med Name: Natuerlo Whole Food Multivitamin for Women 50+     UNABLE TO FIND Take 1 capsule by mouth daily. Med Name: Natuerlo B-Complex with CoQ10     UNABLE TO FIND Take 1 capsule by mouth daily. Med Name: Natuerlo Vegan B12     UNABLE TO FIND Take 1 capsule by mouth daily. Med Name: Naturelo Vitamin D3     UNABLE TO FIND Take 1 capsule by mouth daily. Med Name: Natuerlo Turmeric & Ginger Root     No current facility-administered medications on file prior to visit.    BP 124/70   Pulse 65   Temp (!) 97.3 F (36.3 C) (Temporal)   Ht 5\' 4"  (1.626 m)   Wt 159 lb (72.1 kg)   SpO2 98%   BMI 27.29 kg/m  Objective:   Physical Exam Cardiovascular:     Rate and Rhythm: Normal rate and regular rhythm.  Pulmonary:     Effort: Pulmonary effort is normal.     Breath sounds: Normal  breath sounds.  Musculoskeletal:     Cervical back: Neck supple.  Skin:    General: Skin is warm and dry.  Neurological:     Mental Status: She is alert and oriented to person, place, and time.  Psychiatric:        Mood and Affect: Mood normal.           Assessment & Plan:  Hyperlipidemia, unspecified hyperlipidemia type Assessment & Plan: Repeat lipid panel pending.  Continue regular exercise.  Orders: -     Lipid panel  Osteopenia, unspecified location Assessment & Plan: Improved!  Reviewed bone density scan from 2024. Continue calcium and vitamin D . Continue weightbearing exercise.  Repeat bone density scan in 2026   Dizziness Assessment & Plan: Resolved.  Reviewed echocardiogram from 2025. Await Holter monitor results.   Generalized joint pain Assessment & Plan: Stable.  Continue activity level and exercises.         Tison Leibold K Elika Godar, NP

## 2024-05-13 ENCOUNTER — Ambulatory Visit: Payer: Self-pay | Admitting: Primary Care

## 2024-05-13 LAB — LIPID PANEL
Cholesterol: 227 mg/dL — ABNORMAL HIGH (ref 0–200)
HDL: 65.7 mg/dL (ref >39.00)
LDL Cholesterol: 143 mg/dL — ABNORMAL HIGH (ref 0–99)
NonHDL: 161
Total CHOL/HDL Ratio: 3
Triglycerides: 90 mg/dL (ref 0.0–149.0)
VLDL: 18 mg/dL (ref 0.0–40.0)

## 2024-05-19 DIAGNOSIS — R001 Bradycardia, unspecified: Secondary | ICD-10-CM | POA: Diagnosis not present

## 2024-05-19 DIAGNOSIS — R42 Dizziness and giddiness: Secondary | ICD-10-CM

## 2024-05-30 ENCOUNTER — Ambulatory Visit
Admission: RE | Admit: 2024-05-30 | Discharge: 2024-05-30 | Disposition: A | Discharge status: Home / Self Care | Source: Ambulatory Visit | Attending: Primary Care | Admitting: Primary Care

## 2024-05-30 DIAGNOSIS — Z1231 Encounter for screening mammogram for malignant neoplasm of breast: Secondary | ICD-10-CM | POA: Diagnosis present

## 2024-06-01 ENCOUNTER — Ambulatory Visit: Payer: Self-pay | Admitting: Primary Care
